# Patient Record
Sex: Female | Born: 1940 | Race: White | Hispanic: No | Marital: Single | Smoking: Never smoker
Health system: Southern US, Community
[De-identification: ages and names within clinical notes are randomized; demographics above are authoritative.]

## PROBLEM LIST (undated history)

## (undated) DIAGNOSIS — F329 Major depressive disorder, single episode, unspecified: Secondary | ICD-10-CM

## (undated) DIAGNOSIS — E785 Hyperlipidemia, unspecified: Secondary | ICD-10-CM

## (undated) DIAGNOSIS — F32A Depression, unspecified: Secondary | ICD-10-CM

## (undated) DIAGNOSIS — I4891 Unspecified atrial fibrillation: Secondary | ICD-10-CM

## (undated) DIAGNOSIS — I10 Essential (primary) hypertension: Secondary | ICD-10-CM

## (undated) HISTORY — PX: APPENDECTOMY: SHX54

## (undated) HISTORY — PX: ABDOMINAL HYSTERECTOMY: SHX81

---

## 2015-11-16 ENCOUNTER — Ambulatory Visit (INDEPENDENT_AMBULATORY_CARE_PROVIDER_SITE_OTHER): Payer: Medicare Other | Admitting: Orthopedic Surgery

## 2015-11-16 ENCOUNTER — Ambulatory Visit (INDEPENDENT_AMBULATORY_CARE_PROVIDER_SITE_OTHER): Payer: Medicare Other

## 2015-11-16 VITALS — BP 137/82 | HR 75 | Ht 66.0 in | Wt 188.0 lb

## 2015-11-16 DIAGNOSIS — M19079 Primary osteoarthritis, unspecified ankle and foot: Secondary | ICD-10-CM

## 2015-11-16 DIAGNOSIS — S92901A Unspecified fracture of right foot, initial encounter for closed fracture: Secondary | ICD-10-CM | POA: Diagnosis not present

## 2015-11-16 DIAGNOSIS — M129 Arthropathy, unspecified: Secondary | ICD-10-CM

## 2015-11-16 MED ORDER — IBUPROFEN 800 MG PO TABS: 800.0000 mg | ORAL_TABLET | Freq: Three times a day (TID) | ORAL | Status: DC

## 2015-11-16 NOTE — Patient Instructions (Signed)
ASPERCREME (OTC) THREE TIMES DAILY

## 2015-11-18 NOTE — Progress Notes (Signed)
Chief Complaint  Patient presents with  . New Patient (Initial Visit)    Right foot pain   HPI 75 year old female dropped a vacuum cleaner handle on her right foot. Had no prior symptoms of pain area but since that time about a month ago she's had pain over the dorsum of the foot is described best as aching dull moderate in severity and constant worse with weightbearing  Review of Systems  Constitutional: Negative for fever and chills.  Neurological: Negative for tingling.    No past medical history on file.  No past surgical history on file. No family history on file. Social History  Substance Use Topics  . Smoking status: Not on file  . Smokeless tobacco: Not on file  . Alcohol Use: Not on file    Current outpatient prescriptions:  .  Cholecalciferol (VITAMIN D PO), Take by mouth., Disp: , Rfl:  .  hydrochlorothiazide (MICROZIDE) 12.5 MG capsule, Take 12.5 mg by mouth daily., Disp: , Rfl:  .  ibuprofen (ADVIL,MOTRIN) 800 MG tablet, Take 1 tablet (800 mg total) by mouth 3 (three) times daily., Disp: 90 tablet, Rfl: 5 .  losartan (COZAAR) 50 MG tablet, Take 50 mg by mouth daily., Disp: , Rfl:  .  montelukast (SINGULAIR) 10 MG tablet, Take 10 mg by mouth at bedtime., Disp: , Rfl:  .  Omega-3 Fatty Acids (FISH OIL PO), Take by mouth., Disp: , Rfl:  .  omeprazole (PRILOSEC OTC) 20 MG tablet, Take 20 mg by mouth daily., Disp: , Rfl:  .  simvastatin (ZOCOR) 10 MG tablet, Take 10 mg by mouth daily., Disp: , Rfl:   BP 137/82 mmHg  Pulse 75  Ht 5\' 6"  (1.676 m)  Wt 188 lb (85.276 kg)  BMI 30.36 kg/m2  Physical Exam  Constitutional: She is oriented to person, place, and time. She appears well-developed and well-nourished. No distress.  Cardiovascular: Normal rate and intact distal pulses.   Neurological: She is alert and oriented to person, place, and time. She has normal reflexes. She exhibits normal muscle tone. Coordination normal.  Skin: Skin is warm and dry. No rash noted. She  is not diaphoretic. No erythema. No pallor.  Psychiatric: She has a normal mood and affect. Her behavior is normal. Judgment and thought content normal.    Ortho Exam Standing posture is normal no flatfoot deformity is noted. She ambulates normally.  She has a dorsal spur on the top of the right foot at the midfoot area which is tender to palpation without swelling there does not appear to be joint effusion. The midfoot is stable. She has no atrophy in the foot. Skin is without rash or laceration. There is no erythema.  Pulses normal temperature normal. Sensation is normal in the foot as well.  Left foot and comparison is nontender and alignment is normal.  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 losartan (COZAAR) 50 MG tablet    Sig: Take 50 mg by mouth daily.  . hydrochlorothiazide (MICROZIDE) 12.5 MG capsule    Sig: Take 12.5 mg by mouth daily.  . simvastatin (ZOCOR) 10 MG tablet    Sig: Take 10 mg by mouth daily.  . montelukast (SINGULAIR) 10 MG tablet  Sig: Take 10 mg by mouth at bedtime.  . Omega-3 Fatty Acids (FISH OIL PO)    Sig: Take by mouth.  . Cholecalciferol (VITAMIN D PO)    Sig: Take by mouth.  Marland Kitchen omeprazole (PRILOSEC OTC) 20 MG tablet    Sig: Take 20 mg by mouth daily.  Marland Kitchen ibuprofen (ADVIL,MOTRIN) 800 MG tablet    Sig: Take 1 tablet (800 mg total) by mouth 3 (three) times daily.    Dispense:  90 tablet    Refill:  5

## 2016-01-14 ENCOUNTER — Emergency Department (HOSPITAL_COMMUNITY)
Admission: EM | Admit: 2016-01-14 | Discharge: 2016-01-14 | Disposition: A | Discharge status: Home / Self Care | Payer: Medicare Other | Attending: Emergency Medicine | Admitting: Emergency Medicine

## 2016-01-14 ENCOUNTER — Emergency Department (HOSPITAL_COMMUNITY): Payer: Medicare Other

## 2016-01-14 ENCOUNTER — Encounter (HOSPITAL_COMMUNITY): Payer: Self-pay | Admitting: Emergency Medicine

## 2016-01-14 DIAGNOSIS — R002 Palpitations: Secondary | ICD-10-CM | POA: Diagnosis present

## 2016-01-14 DIAGNOSIS — Z79899 Other long term (current) drug therapy: Secondary | ICD-10-CM | POA: Insufficient documentation

## 2016-01-14 DIAGNOSIS — Z791 Long term (current) use of non-steroidal anti-inflammatories (NSAID): Secondary | ICD-10-CM | POA: Diagnosis not present

## 2016-01-14 DIAGNOSIS — I10 Essential (primary) hypertension: Secondary | ICD-10-CM | POA: Insufficient documentation

## 2016-01-14 DIAGNOSIS — I480 Paroxysmal atrial fibrillation: Secondary | ICD-10-CM | POA: Diagnosis not present

## 2016-01-14 DIAGNOSIS — I4892 Unspecified atrial flutter: Secondary | ICD-10-CM

## 2016-01-14 HISTORY — DX: Hyperlipidemia, unspecified: E78.5

## 2016-01-14 HISTORY — DX: Major depressive disorder, single episode, unspecified: F32.9

## 2016-01-14 HISTORY — DX: Essential (primary) hypertension: I10

## 2016-01-14 HISTORY — DX: Depression, unspecified: F32.A

## 2016-01-14 LAB — COMPREHENSIVE METABOLIC PANEL
ALK PHOS: 55 U/L (ref 38–126)
ALT: 17 U/L (ref 14–54)
AST: 15 U/L (ref 15–41)
Albumin: 4 g/dL (ref 3.5–5.0)
Anion gap: 6 (ref 5–15)
BUN: 28 mg/dL — ABNORMAL HIGH (ref 6–20)
CALCIUM: 8.8 mg/dL — AB (ref 8.9–10.3)
CO2: 23 mmol/L (ref 22–32)
CREATININE: 1.03 mg/dL — AB (ref 0.44–1.00)
Chloride: 107 mmol/L (ref 101–111)
GFR, EST NON AFRICAN AMERICAN: 52 mL/min — AB (ref >60)
Glucose, Bld: 116 mg/dL — ABNORMAL HIGH (ref 65–99)
Potassium: 3.8 mmol/L (ref 3.5–5.1)
Sodium: 136 mmol/L (ref 135–145)
Total Bilirubin: 0.9 mg/dL (ref 0.3–1.2)
Total Protein: 7 g/dL (ref 6.5–8.1)

## 2016-01-14 LAB — CBC WITH DIFFERENTIAL/PLATELET
BASOS PCT: 0 %
Basophils Absolute: 0 10*3/uL (ref 0.0–0.1)
EOS ABS: 0 10*3/uL (ref 0.0–0.7)
EOS PCT: 1 %
HCT: 38.1 % (ref 36.0–46.0)
HEMOGLOBIN: 12.3 g/dL (ref 12.0–15.0)
Lymphocytes Relative: 41 %
Lymphs Abs: 3.4 10*3/uL (ref 0.7–4.0)
MCH: 25.3 pg — ABNORMAL LOW (ref 26.0–34.0)
MCHC: 32.3 g/dL (ref 30.0–36.0)
MCV: 78.4 fL (ref 78.0–100.0)
MONOS PCT: 9 %
Monocytes Absolute: 0.7 10*3/uL (ref 0.1–1.0)
NEUTROS PCT: 49 %
Neutro Abs: 4.1 10*3/uL (ref 1.7–7.7)
PLATELETS: 267 10*3/uL (ref 150–400)
RBC: 4.86 MIL/uL (ref 3.87–5.11)
RDW: 14.6 % (ref 11.5–15.5)
WBC: 8.3 10*3/uL (ref 4.0–10.5)

## 2016-01-14 LAB — TROPONIN I

## 2016-01-14 LAB — TSH: TSH: 1.536 u[IU]/mL (ref 0.350–4.500)

## 2016-01-14 MED ORDER — DILTIAZEM HCL 25 MG/5ML IV SOLN
20.0000 mg | Freq: Once | INTRAVENOUS | Status: AC
  Administered 2016-01-14: 20 mg via INTRAVENOUS
  Filled 2016-01-14: qty 5

## 2016-01-14 MED ORDER — DILTIAZEM HCL ER COATED BEADS 120 MG PO CP24: 120.0000 mg | ORAL_CAPSULE | Freq: Every day | ORAL | 1 refills | Status: DC

## 2016-01-14 MED ORDER — APIXABAN 5 MG PO TABS
ORAL_TABLET | ORAL | Status: AC
  Filled 2016-01-14: qty 1

## 2016-01-14 MED ORDER — APIXABAN 5 MG PO TABS
5.0000 mg | ORAL_TABLET | Freq: Two times a day (BID) | ORAL | Status: AC
  Administered 2016-01-14: 5 mg via ORAL
  Filled 2016-01-14: qty 1

## 2016-01-14 MED ORDER — LOSARTAN POTASSIUM 50 MG PO TABS: 50.0000 mg | ORAL_TABLET | Freq: Every day | ORAL | 1 refills | Status: DC

## 2016-01-14 MED ORDER — APIXABAN 5 MG PO TABS: 5.0000 mg | ORAL_TABLET | Freq: Two times a day (BID) | ORAL | 1 refills | Status: DC

## 2016-01-14 NOTE — Discharge Instructions (Signed)
Stop taking the medicine called Losartan /HCTZ - this is your current blood pressure medications  Start taking Losartan 50mg  tablet once daily Start taking Diltiazem 120 mg once daily Start taking Eliquis every 12 hours (5mg  tab)  Call the afib clinic on Monday for follow up.  Please obtain all of your results from medical records or have your doctors office obtain the results - share them with your doctor - you should be seen at your doctors office in the next 2 days. Call today to arrange your follow up. Take the medications as prescribed. Please review all of the medicines and only take them if you do not have an allergy to them. Please be aware that if you are taking birth control pills, taking other prescriptions, ESPECIALLY ANTIBIOTICS may make the birth control ineffective - if this is the case, either do not engage in sexual activity or use alternative methods of birth control such as condoms until you have finished the medicine and your family doctor says it is OK to restart them. If you are on a blood thinner such as COUMADIN, be aware that any other medicine that you take may cause the coumadin to either work too much, or not enough - you should have your coumadin level rechecked in next 7 days if this is the case.  ?  It is also a possibility that you have an allergic reaction to any of the medicines that you have been prescribed - Everybody reacts differently to medications and while MOST people have no trouble with most medicines, you may have a reaction such as nausea, vomiting, rash, swelling, shortness of breath. If this is the case, please stop taking the medicine immediately and contact your physician.  ?  You should return to the ER if you develop severe or worsening symptoms.

## 2016-01-14 NOTE — ED Provider Notes (Signed)
[facility]EM[facility]Y DEPT Provider Note   CSN: [id number] Arrival date & time: 01/14/16  1339  First Provider Contact:   First MD Initiated Contact with Patient 01/14/16 1352     By signing my name below, I, Tanda Rockers, attest that this documentation has been prepared under the direction and in the presence of Eber Hong, MD. Electronically Signed: Tanda Rockers, ED Scribe. 01/14/16. 1:59 PM   History   Chief Complaint Chief Complaint  Patient presents with  . Palpitations    HPI Elizabeth Sherman is a 75 y.o. female with PMHx HTN, HLD, who presents to the Emergency Department for evaluation. Pt was seen at PCP's office 5 days ago to get a new prescription. She states that her doctor told her "your heart is going every which way." Pt reports that she came to the ED today for evaluation of her heart. Pt reports that she had had intermittent palpations for some time but is not having it now and feels completely fine. She was started on a new anti-depressant medication 5 days ago but has otherwise not had any changes in medication in the past month. Denies palpitation, shortness of breath, chest pain, fever, cough, leg swelling, unexpected weight loss, unexpected weight gain, or any other associated symptoms.  No hx kidney issues. No hx lung issues. Pt does not drink EtOH, smokes cigarettes, or uses illicit drugs.   The history is provided by the patient. No language interpreter was used.    Past Medical History:  Diagnosis Date  . Depression   . Hyperlipidemia   . Hypertension     There are no active problems to display for this patient.   Past Surgical History:  Procedure Laterality Date  . ABDOMINAL HYSTERECTOMY    . APPENDECTOMY      OB History    Gravida Para Term Preterm AB Living   SAB TAB Ectopic Multiple Live Births                   Home Medications    Prior to Admission medications   Medication Sig Start Date End Date Taking? Authorizing  Provider  Cholecalciferol (VITAMIN D PO) Take by mouth.   Yes Historical Provider, MD  citalopram (CELEXA) 20 MG tablet Take 1 tablet by mouth daily. 01/11/16  Yes Historical Provider, MD  ibuprofen (ADVIL,MOTRIN) 800 MG tablet Take 1 tablet (800 mg total) by mouth 3 (three) times daily. 11/16/15  Yes Vickki Hearing, MD  montelukast (SINGULAIR) 10 MG tablet Take 10 mg by mouth at bedtime.   Yes Historical Provider, MD  Omega-3 Fatty Acids (FISH OIL PO) Take by mouth.   Yes Historical Provider, MD  omeprazole (PRILOSEC OTC) 20 MG tablet Take 20 mg by mouth daily.   Yes Historical Provider, MD  simvastatin (ZOCOR) 10 MG tablet Take 10 mg by mouth daily.   Yes Historical Provider, MD  apixaban (ELIQUIS) 5 MG TABS tablet Take 1 tablet (5 mg total) by mouth 2 (two) times daily. 01/14/16   Eber Hong, MD  diltiazem (CARDIZEM CD) 120 MG 24 hr capsule Take 1 capsule (120 mg total) by mouth daily. 01/14/16   Eber Hong, MD  losartan (COZAAR) 50 MG tablet Take 1 tablet (50 mg total) by mouth daily. 01/14/16   Eber Hong, MD    Family History Family History  Problem Relation Age of Onset  . Heart failure Mother     Social History Social  History  Substance Use Topics  . Smoking status: Never Smoker  . Smokeless tobacco: Never Used  . Alcohol use No     Allergies   Review of patient's allergies indicates no known allergies.   Review of Systems Review of Systems  Constitutional: Negative for fever and unexpected weight change.  Respiratory: Negative for shortness of breath.   Cardiovascular: Negative for chest pain, palpitations and leg swelling.  All other systems reviewed and are negative.    Physical Exam Updated Vital Signs BP 110/73   Pulse 76   Resp 15   SpO2 97%   Physical Exam  Constitutional: She appears well-developed and well-nourished. No distress.  HENT:  Head: Normocephalic and atraumatic.  Mouth/Throat: Oropharynx is clear and moist. No oropharyngeal exudate.   Eyes: Conjunctivae and EOM are normal. Pupils are equal, round, and reactive to light. Right eye exhibits no discharge. Left eye exhibits no discharge. No scleral icterus.  Neck: Normal range of motion. Neck supple. No JVD present. No thyromegaly present.  Cardiovascular: Normal heart sounds and intact distal pulses.  An irregularly irregular rhythm present. Tachycardia present.  Exam reveals no gallop and no friction rub.   No murmur heard. Pulses:      Radial pulses are 2+ on the right side, and 2+ on the left side.  Pulmonary/Chest: Effort normal and breath sounds normal. No respiratory distress. She has no wheezes. She has no rales.  Abdominal: Soft. Bowel sounds are normal. She exhibits no distension and no mass. There is no tenderness.  Musculoskeletal: Normal range of motion. She exhibits no edema or tenderness.  Lymphadenopathy:    She has no cervical adenopathy.  Neurological: She is alert. Coordination normal.  Skin: Skin is warm and dry. No rash noted. No erythema.  Psychiatric: She has a normal mood and affect. Her behavior is normal.  Nursing note and vitals reviewed.    ED Treatments / Results   DIAGNOSTIC STUDIES: Oxygen Saturation is 100% on RA, normal by my interpretation.    COORDINATION OF CARE: 1:57 PM-Discussed treatment plan with pt at bedside and pt agreed to plan.   Labs (all labs ordered are listed, but only abnormal results are displayed) Labs Reviewed  COMPREHENSIVE METABOLIC PANEL - Abnormal; Notable for the following:       Result Value   Glucose, Bld 116 (*)    BUN 28 (*)    Creatinine, Ser 1.03 (*)    Calcium 8.8 (*)    GFR calc non Af Amer 52 (*)    All other components within normal limits  CBC WITH DIFFERENTIAL/PLATELET - Abnormal; Notable for the following:    MCH 25.3 (*)    All other components within normal limits  TROPONIN I  TSH    EKG  EKG Interpretation  Date/Time:  Saturday January 14 2016 13:48:16 EDT Ventricular Rate:   122 PR Interval:    QRS Duration: 58 QT Interval:  383 QTC Calculation: 546 R Axis:   -37 Text Interpretation:  Atrial flutter with variable A-V block Nonspecific T wave abnormality No old tracing to compare Abnormal ekg Confirmed by Hyacinth Meeker  MD, Harris Kistler ([id number]) on 01/14/2016 2:09:47 PM       Radiology Dg Chest 2 View  Result Date: 01/14/2016 CLINICAL DATA:  Cardiac palpitations EXAM: CHEST  2 VIEW COMPARISON:  None. FINDINGS: Cardiac shadow is enlarged. A large hiatal hernia is noted. The lungs are well aerated bilaterally. No focal infiltrate or sizable effusion is seen. No acute bony abnormality  is noted. IMPRESSION: Hiatal hernia. No other focal abnormality is noted. Electronically Signed   By: Alcide Clever M.D.   On: 01/14/2016 14:41   Procedures Procedures (including critical care time)  Medications Ordered in ED Medications  apixaban (ELIQUIS) tablet 5 mg (not administered)  diltiazem (CARDIZEM) injection 20 mg (20 mg Intravenous Given 01/14/16 1405)     Initial Impression / Assessment and Plan / ED Course  I have reviewed the triage vital signs and the nursing notes.  Pertinent labs & imaging results that were available during my care of the patient were reviewed by me and considered in my medical decision making (see chart for details).  Clinical Course  Comment By Time  The patient has normal creatinine, BUN is slightly elevated, electrolytes otherwise normal. Troponin normal, blood counts unremarkable, thyroid panel can be followed up by the family doctor. The heart rate has improved to between 60 and 70, the patient remains with an abnormal rhythm which is likely atrial flutter. She will be transitioned on oral medications and encouraged to follow-up closely with cardiology. Eber Hong, MD 07/29 1447  Care was discussed with the cardiologist, Dr. Duke Salvia at 3:00 PM, she recommends starting Eliquis twice a day, adding long-acting Cardizem once a day in place of her  current blood pressure medication is a combination of losartan and hydrochlorothiazide. The patient has been informed that she will be given a new medication eliminating hydrochlorothiazide from her medication regimen. I have discussed with the patient at length the indications for blood thinner as well as the risks of being on an anticoagulant. She is accepting these risks including life-threatening bleeding. She will be given a dose of Eliquis and sent home with prescriptions for the same. She is currently rate controlled very well with no symptoms, she will follow up in [facility] clinic. Eber Hong, MD 07/29 1503       I personally performed the services described in this documentation, which was scribed in my presence. The recorded information has been reviewed and is accurate.      Final Clinical Impressions(s) / ED Diagnoses   Final diagnoses:  Atrial flutter with rapid ventricular response (HCC)    New Prescriptions New Prescriptions   APIXABAN (ELIQUIS) 5 MG TABS TABLET    Take 1 tablet (5 mg total) by mouth 2 (two) times daily.   DILTIAZEM (CARDIZEM CD) 120 MG 24 HR CAPSULE    Take 1 capsule (120 mg total) by mouth daily.   LOSARTAN (COZAAR) 50 MG TABLET    Take 1 tablet (50 mg total) by mouth daily.     Eber Hong, MD 01/14/16 [phone]

## 2016-01-14 NOTE — ED Notes (Signed)
Pt denies SOB, CP, palpitations, or diaphoresis. When asked why she came to ED today she reports "to get checked out", denies any cardiac hx. Pt is A&O X4.

## 2016-01-14 NOTE — ED Notes (Signed)
AC to bring Eliquis.  

## 2016-01-14 NOTE — ED Triage Notes (Signed)
Patient reports intermittent feelings of sweating with palpitations since Thursday. Denies any chest pain or shortness of breath. Per patient was seen by PCP on Tuesday and states "He told me my heart was going every which a way." Denies any cardiac hx.

## 2016-01-14 NOTE — ED Notes (Signed)
Pt returned from xray at this time.

## 2016-01-20 ENCOUNTER — Encounter (HOSPITAL_COMMUNITY): Payer: Self-pay | Admitting: Nurse Practitioner

## 2016-01-20 ENCOUNTER — Ambulatory Visit (HOSPITAL_COMMUNITY)
Admission: RE | Admit: 2016-01-20 | Discharge: 2016-01-20 | Disposition: A | Discharge status: Home / Self Care | Payer: Medicare Other | Source: Ambulatory Visit | Attending: Nurse Practitioner | Admitting: Nurse Practitioner

## 2016-01-20 VITALS — BP 148/80 | HR 103 | Ht 66.0 in | Wt 191.2 lb

## 2016-01-20 DIAGNOSIS — I481 Persistent atrial fibrillation: Secondary | ICD-10-CM | POA: Diagnosis not present

## 2016-01-20 DIAGNOSIS — Z7982 Long term (current) use of aspirin: Secondary | ICD-10-CM | POA: Insufficient documentation

## 2016-01-20 DIAGNOSIS — Z90710 Acquired absence of both cervix and uterus: Secondary | ICD-10-CM | POA: Insufficient documentation

## 2016-01-20 DIAGNOSIS — Z791 Long term (current) use of non-steroidal anti-inflammatories (NSAID): Secondary | ICD-10-CM | POA: Diagnosis not present

## 2016-01-20 DIAGNOSIS — F329 Major depressive disorder, single episode, unspecified: Secondary | ICD-10-CM | POA: Insufficient documentation

## 2016-01-20 DIAGNOSIS — Z8249 Family history of ischemic heart disease and other diseases of the circulatory system: Secondary | ICD-10-CM | POA: Diagnosis not present

## 2016-01-20 DIAGNOSIS — I10 Essential (primary) hypertension: Secondary | ICD-10-CM | POA: Insufficient documentation

## 2016-01-20 DIAGNOSIS — Z7901 Long term (current) use of anticoagulants: Secondary | ICD-10-CM | POA: Diagnosis not present

## 2016-01-20 DIAGNOSIS — Z9889 Other specified postprocedural states: Secondary | ICD-10-CM | POA: Diagnosis not present

## 2016-01-20 DIAGNOSIS — I4891 Unspecified atrial fibrillation: Secondary | ICD-10-CM | POA: Insufficient documentation

## 2016-01-20 DIAGNOSIS — I4819 Other persistent atrial fibrillation: Secondary | ICD-10-CM

## 2016-01-20 DIAGNOSIS — Z79899 Other long term (current) drug therapy: Secondary | ICD-10-CM | POA: Insufficient documentation

## 2016-01-20 DIAGNOSIS — E785 Hyperlipidemia, unspecified: Secondary | ICD-10-CM | POA: Insufficient documentation

## 2016-01-20 MED ORDER — APIXABAN 5 MG PO TABS: 5.0000 mg | ORAL_TABLET | Freq: Two times a day (BID) | ORAL | 1 refills | Status: DC

## 2016-01-20 NOTE — Progress Notes (Signed)
Patient ID: [name], female   DOB: 27-Jul-1940, 75 y.o.   MRN: [id number]      Primary Care Physician: Maximiano Coss, MD Referring Physician: [facility] f/u   Elizabeth Sherman is a 75 y.o. female with a h/o new onset afib. This was dx at her PCP recently, picked up at the exam, pt was unaware. She then decided she would go to the ER for her heart to be evaluated. She was asymptomatic. She was given one eliquis for that evening, rx for cardizem, and was referred here.  She is now on cardizem and is in afib at 53 bpm, again states that she feels well and denies any shortness of breath, or fatigue. 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 She does not thinks she snores, no smoking, no alcohol, no large amounts of caffeine.Chadsvasc score of at least 4.  Today, she denies symptoms of palpitations, chest pain, shortness of breath, orthopnea, PND, lower extremity edema, dizziness, presyncope, syncope, or neurologic sequela. The patient is tolerating medications without difficulties and is otherwise without complaint today.   Past Medical History:  Diagnosis Date  . Depression   . Hyperlipidemia   . Hypertension    Past Surgical History:  Procedure Laterality Date  . ABDOMINAL HYSTERECTOMY    . APPENDECTOMY      Current Outpatient Prescriptions  Medication Sig Dispense Refill  . aspirin 325 MG tablet Take 325 mg by mouth daily.    . Cholecalciferol (VITAMIN D PO) Take by mouth.    . citalopram (CELEXA) 20 MG tablet Take 1 tablet by mouth daily.  5  . diltiazem (CARDIZEM CD) 120 MG 24 hr capsule Take 1 capsule (120 mg total) by mouth daily. 30 capsule 1  . ibuprofen (ADVIL,MOTRIN) 800 MG tablet Take 1 tablet (800 mg total) by mouth 3 (three) times daily. 90 tablet 5  . losartan (COZAAR) 50 MG tablet Take 1 tablet (50 mg total) by mouth daily. 30 tablet 1  . montelukast (SINGULAIR) 10 MG tablet Take 10 mg by mouth at bedtime.    . Omega-3 Fatty  Acids (FISH OIL PO) Take by mouth.    Marland Kitchen omeprazole (PRILOSEC OTC) 20 MG tablet Take 20 mg by mouth daily.    . simvastatin (ZOCOR) 10 MG tablet Take 10 mg by mouth daily.    Marland Kitchen apixaban (ELIQUIS) 5 MG TABS tablet Take 1 tablet (5 mg total) by mouth 2 (two) times daily. 60 tablet 1   No current facility-administered medications for this encounter.     No Known Allergies  Social History   Social History  . Marital status: Single    Spouse name: N/A  . Number of children: N/A  . Years of education: N/A   Occupational History  . Not on file.   Social History Main Topics  . Smoking status: Never Smoker  . Smokeless tobacco: Never Used  . Alcohol use No  . Drug use: No  . Sexual activity: Not on file   Other Topics Concern  . Not on file   Social History Narrative  . No narrative on file    Family History  Problem Relation Age of Onset  . Heart failure Mother     ROS- All systems are reviewed and negative except as per the HPI above  Physical Exam: Vitals:   01/20/16 0844  BP: (!) 148/80  Pulse: (!) 103  Weight: 191 lb 3.2 oz (86.7 kg)  Height:  5\' 6"  (1.676 m)    GEN- The patient is well appearing, alert and oriented x 3 today.   Head- normocephalic, atraumatic Eyes-  Sclera clear, conjunctiva pink Ears- hearing intact Oropharynx- clear Neck- supple, no JVP Lymph- no cervical lymphadenopathy Lungs- Clear to ausculation bilaterally, normal work of breathing Heart- irregular rate and rhythm, no murmurs, rubs or gallops, PMI not laterally displaced GI- soft, NT, ND, + BS Extremities- no clubbing, cyanosis, or edema MS- no significant deformity or atrophy Skin- no rash or lesion Psych- euthymic mood, full affect Neuro- strength and sensation are intact  EKG-afib at 99 bpm Epic records reviewed Creatinine 1.03, BUN 28   Assessment and Plan: 1. New onset afib, asymptomatic Continue cardizem at 180 mg qd, for now, may need to increase rate control on f/u  visits Stop asa Rx for free 30 days eliquis 5 mg bid but pt does not have medicare D and price of drug will be $500, she will call insurance and see if she can get signed up for medicare D so she can afford drug, may have to use coumadin in the interium.  Will consider cardioversion when she has been on anticoagulant x 3 weeks. Echo Educated re afib and management F/u in 2-3 weeks  Elvina Sidle. [facility] [address] [place], [place] [address] [phone]

## 2016-01-20 NOTE — Patient Instructions (Signed)
Your physician has recommended you make the following change in your medication:  1)Stop aspirin 2)Start Eliquis 5mg twice a day  

## 2016-01-23 ENCOUNTER — Encounter (HOSPITAL_COMMUNITY): Payer: Self-pay | Admitting: Emergency Medicine

## 2016-01-23 ENCOUNTER — Telehealth (HOSPITAL_COMMUNITY): Payer: Self-pay | Admitting: Nurse Practitioner

## 2016-01-23 ENCOUNTER — Emergency Department (HOSPITAL_COMMUNITY)
Admission: EM | Admit: 2016-01-23 | Discharge: 2016-01-23 | Disposition: A | Discharge status: Home / Self Care | Payer: Medicare Other | Attending: Emergency Medicine | Admitting: Emergency Medicine

## 2016-01-23 ENCOUNTER — Emergency Department (HOSPITAL_COMMUNITY): Payer: Medicare Other

## 2016-01-23 DIAGNOSIS — I10 Essential (primary) hypertension: Secondary | ICD-10-CM | POA: Diagnosis not present

## 2016-01-23 DIAGNOSIS — I4891 Unspecified atrial fibrillation: Secondary | ICD-10-CM | POA: Insufficient documentation

## 2016-01-23 DIAGNOSIS — Z79899 Other long term (current) drug therapy: Secondary | ICD-10-CM | POA: Diagnosis not present

## 2016-01-23 DIAGNOSIS — R0789 Other chest pain: Secondary | ICD-10-CM

## 2016-01-23 LAB — I-STAT TROPONIN, ED: Troponin i, poc: 0 ng/mL (ref 0.00–0.08)

## 2016-01-23 LAB — CBC
HEMATOCRIT: 38 % (ref 36.0–46.0)
HEMOGLOBIN: 12.3 g/dL (ref 12.0–15.0)
MCH: 25.8 pg — ABNORMAL LOW (ref 26.0–34.0)
MCHC: 32.4 g/dL (ref 30.0–36.0)
MCV: 79.7 fL (ref 78.0–100.0)
Platelets: 265 10*3/uL (ref 150–400)
RBC: 4.77 MIL/uL (ref 3.87–5.11)
RDW: 15.1 % (ref 11.5–15.5)
WBC: 8.7 10*3/uL (ref 4.0–10.5)

## 2016-01-23 LAB — BASIC METABOLIC PANEL
ANION GAP: 6 (ref 5–15)
BUN: 20 mg/dL (ref 6–20)
CHLORIDE: 108 mmol/L (ref 101–111)
CO2: 24 mmol/L (ref 22–32)
Calcium: 8.1 mg/dL — ABNORMAL LOW (ref 8.9–10.3)
Creatinine, Ser: 0.94 mg/dL (ref 0.44–1.00)
GFR calc Af Amer: >60 mL/min (ref >60)
GFR, EST NON AFRICAN AMERICAN: 58 mL/min — AB (ref >60)
GLUCOSE: 115 mg/dL — AB (ref 65–99)
POTASSIUM: 4 mmol/L (ref 3.5–5.1)
SODIUM: 138 mmol/L (ref 135–145)

## 2016-01-23 LAB — TROPONIN I: Troponin I: <0.03 ng/mL (ref <0.03)

## 2016-01-23 NOTE — ED Notes (Signed)
Pt back from xray, family at the bedside   

## 2016-01-23 NOTE — Telephone Encounter (Signed)
Pt called 8:35 this am stating that she had pain in the center of her chest without radiation,no diaphoresis, no nausea, dyspnea.. Does not feel like indigestion and movement of chest does not  worsen. BP 140/80 and HR is 80.I advised for pt to go to ER at [facility], since this is closest  hospital to her. I don't feel that chest pain is related to afib since HR is low. She indicated that this is what she would do.

## 2016-01-23 NOTE — ED Triage Notes (Signed)
Pt reports onset of centralized chest pain this am. Pt was sitting when it started. Denies other symptoms.

## 2016-01-23 NOTE — ED Provider Notes (Signed)
[facility]-EM[facility]Y DEPT Provider Note   CSN: [id number] Arrival date & time: 01/23/16  1052  First Provider Contact:   None    By signing my name below, I, Placido Sou, attest that this documentation has been prepared under the direction and in the presence of Jacalyn Lefevre, MD. Electronically Signed: Placido Sou, ED Scribe. 01/23/16. 12:27 PM.    History   Chief Complaint Chief Complaint  Patient presents with  . Chest Pain    HPI HPI Comments: Elizabeth Sherman is a 75 y.o. female who presents to the Emergency Department complaining of moderate centralized CP which occurred this morning. Pt states her CP began white sitting and watching television and quickly alleviated. She denies any other regions of pain and reports a PMHx of atrial fibrillation. She has an echocardiogram scheduled in 1 week. She is currently on anticoagulants for her a-fib. She denies any other associated symptoms at this time. Pt said that she has been nervous about her recent diagnosis of her a.fib.  She did call her cardiologist prior to coming in and they told her to come to the ED.  Pt said that she does not think it is her heart, but wanted to make sure.  The history is provided by the patient. No language interpreter was used.    Past Medical History:  Diagnosis Date  . Depression   . Hyperlipidemia   . Hypertension     There are no active problems to display for this patient.   Past Surgical History:  Procedure Laterality Date  . ABDOMINAL HYSTERECTOMY    . APPENDECTOMY      OB History    Gravida Para Term Preterm AB Living   2 2 2     2    SAB TAB Ectopic Multiple Live Births                   Home Medications    Prior to Admission medications   Medication Sig Start Date End Date Taking? Authorizing Provider  apixaban (ELIQUIS) 5 MG TABS tablet Take 1 tablet (5 mg total) by mouth 2 (two) times daily. 01/20/16  Yes Newman Nip, NP  Cholecalciferol (VITAMIN D PO) Take by mouth.    Yes Historical Provider, MD  citalopram (CELEXA) 20 MG tablet Take 1 tablet by mouth every evening.  01/11/16  Yes Historical Provider, MD  diltiazem (CARDIZEM CD) 120 MG 24 hr capsule Take 1 capsule (120 mg total) by mouth daily. 01/14/16  Yes Eber Hong, MD  losartan (COZAAR) 50 MG tablet Take 1 tablet (50 mg total) by mouth daily. 01/14/16  Yes Eber Hong, MD  Omega-3 Fatty Acids (FISH OIL PO) Take 1 capsule by mouth daily.    Yes Historical Provider, MD  omeprazole (PRILOSEC OTC) 20 MG tablet Take 20 mg by mouth daily.   Yes Historical Provider, MD  simvastatin (ZOCOR) 10 MG tablet Take 10 mg by mouth daily at 6 PM.    Yes Historical Provider, MD    Family History Family History  Problem Relation Age of Onset  . Heart failure Mother     Social History Social History  Substance Use Topics  . Smoking status: Never Smoker  . Smokeless tobacco: Never Used  . Alcohol use No     Allergies   Review of patient's allergies indicates no known allergies.   Review of Systems Review of Systems  Cardiovascular: Positive for chest pain.  All other systems reviewed and are negative.  Physical Exam  Updated Vital Signs BP 120/67   Pulse 70   Temp 97.6 F (36.4 C) (Oral)   Resp 15   Ht  (1.676 m)   Wt 190 lb (86.2 kg)   SpO2 98%   BMI 30.67 kg/m   Physical Exam  Constitutional: She is oriented to person, place, and time. She appears well-developed and well-nourished.  HENT:  Head: Normocephalic and atraumatic.  Eyes: EOM are normal. Pupils are equal, round, and reactive to light.  Neck: Normal range of motion.  Cardiovascular: Normal rate.   Atrial fibrillation   Pulmonary/Chest: Effort normal and breath sounds normal. No respiratory distress. She has no wheezes. She has no rales.  Abdominal: Soft. There is no tenderness.  Musculoskeletal: Normal range of motion.  Neurological: She is alert and oriented to person, place, and time.  Skin: Skin is warm and dry.    Psychiatric: She has a normal mood and affect. Her behavior is normal.  Nursing note and vitals reviewed.  ED Treatments / Results  Labs (all labs ordered are listed, but only abnormal results are displayed) Labs Reviewed  BASIC METABOLIC PANEL - Abnormal; Notable for the following:       Result Value   Glucose, Bld 115 (*)    Calcium 8.1 (*)    GFR calc non Af Amer 58 (*)    All other components within normal limits  CBC - Abnormal; Notable for the following:    MCH 25.8 (*)    All other components within normal limits  TROPONIN I  I-STAT TROPOININ, ED    EKG  EKG Interpretation  Date/Time:  Monday January 23 2016 10:59:42 EDT Ventricular Rate:  99 PR Interval:    QRS Duration: 68 QT Interval:  401 QTC Calculation: 460 R Axis:   6 Text Interpretation:  Atrial fibrillation Low voltage, precordial leads Borderline repolarization abnormality inferior wall t wave changes appear new Confirmed by Particia Nearing MD, Mazen Marcin ([id number]) on 01/23/2016 12:23:40 PM       Radiology Dg Chest 2 View  Result Date: 01/23/2016 CLINICAL DATA:  Chest pain EXAM: CHEST  2 VIEW COMPARISON:  01/14/2016 FINDINGS: Low volume film. The cardio pericardial silhouette is enlarged. The lungs are clear wiithout focal pneumonia, edema, pneumothorax or pleural effusion. Interstitial markings are diffusely coarsened with chronic features. The visualized bony structures of the thorax are intact. Telemetry leads overlie the chest. Hiatal hernia again noted. IMPRESSION: Cardiomegaly without acute cardiopulmonary findings. Hiatal hernia. Electronically Signed   By: Kennith Center M.D.   On: 01/23/2016 12:03    Procedures Procedures  DIAGNOSTIC STUDIES: Oxygen Saturation is 100% on RA, normal by my interpretation.    COORDINATION OF CARE: 12:25 PM Discussed next steps with pt. Pt verbalized understanding and is agreeable with the plan.    Medications Ordered in ED Medications - No data to display   Initial  Impression / Assessment and Plan / ED Course  I have reviewed the triage vital signs and the nursing notes.  Pertinent labs & imaging results that were available during my care of the patient were reviewed by me and considered in my medical decision making (see chart for details).  Clinical Course   No cp since before 0900.  The pain is atypical in presentation and pt has 2 negative sets of cardiac enzymes.  She has an appt with cardiology in the next few days for an ECHO.  Pt is on Eliquis and she has no sob or continued cp, so I think  the likelihood of a pe is low.  I think pt is ok for d/c.  She knows to return if worse.  Final Clinical Impressions(s) / ED Diagnoses   Final diagnoses:  Atypical chest pain    New Prescriptions New Prescriptions   No medications on file     Jacalyn LefevreJulie Isa Hitz, MD 01/23/16 1510

## 2016-01-30 ENCOUNTER — Other Ambulatory Visit: Payer: Self-pay

## 2016-01-30 ENCOUNTER — Ambulatory Visit (HOSPITAL_COMMUNITY)
Admission: RE | Admit: 2016-01-30 | Discharge: 2016-01-30 | Disposition: A | Discharge status: Home / Self Care | Payer: Medicare Other | Source: Ambulatory Visit | Attending: Nurse Practitioner | Admitting: Nurse Practitioner

## 2016-01-30 ENCOUNTER — Encounter (HOSPITAL_COMMUNITY): Payer: Self-pay | Admitting: Nurse Practitioner

## 2016-01-30 ENCOUNTER — Ambulatory Visit (HOSPITAL_BASED_OUTPATIENT_CLINIC_OR_DEPARTMENT_OTHER)
Admission: RE | Admit: 2016-01-30 | Discharge: 2016-01-30 | Disposition: A | Discharge status: Home / Self Care | Payer: Medicare Other | Source: Ambulatory Visit | Attending: Nurse Practitioner | Admitting: Nurse Practitioner

## 2016-01-30 VITALS — BP 160/84 | Ht 66.0 in | Wt 192.4 lb

## 2016-01-30 DIAGNOSIS — I4819 Other persistent atrial fibrillation: Secondary | ICD-10-CM

## 2016-01-30 DIAGNOSIS — E785 Hyperlipidemia, unspecified: Secondary | ICD-10-CM | POA: Diagnosis not present

## 2016-01-30 DIAGNOSIS — I481 Persistent atrial fibrillation: Secondary | ICD-10-CM | POA: Diagnosis not present

## 2016-01-30 DIAGNOSIS — F329 Major depressive disorder, single episode, unspecified: Secondary | ICD-10-CM | POA: Insufficient documentation

## 2016-01-30 DIAGNOSIS — R9431 Abnormal electrocardiogram [ECG] [EKG]: Secondary | ICD-10-CM | POA: Diagnosis not present

## 2016-01-30 DIAGNOSIS — I119 Hypertensive heart disease without heart failure: Secondary | ICD-10-CM | POA: Insufficient documentation

## 2016-01-30 DIAGNOSIS — I340 Nonrheumatic mitral (valve) insufficiency: Secondary | ICD-10-CM | POA: Insufficient documentation

## 2016-01-30 DIAGNOSIS — I4891 Unspecified atrial fibrillation: Secondary | ICD-10-CM | POA: Diagnosis present

## 2016-01-30 NOTE — Progress Notes (Signed)
  Echocardiogram 2D Echocardiogram has been performed.  Arvil ChacoFoster, Elvan Ebron 01/30/2016, 3:07 PM

## 2016-01-30 NOTE — Patient Instructions (Signed)
Cardioversion scheduled for Friday, September 1st  - Ar[facility] at [facility] and go to admitting at [facility]10[facility]  -Do not eat or drink anything after midnight the night prior to your procedure.  - Take all your medication with a sip of water prior to arrival.  - You will not be able to drive home after your procedure.

## 2016-01-31 NOTE — Progress Notes (Signed)
Patient ID: Elizabeth Sherman, female   DOB: 1941/04/01, 75 y.o.   MRN: [id number]      Primary Care Physician: Maximiano CossHUNGARLAND,JOHN DAVID, MD Referring Physician[facility]   Elizabeth Sherman is a 75 y.o. female with a h/o new onset afib. This was dx at her PCP recently, picked up at the exam, pt was unaware. She then decided she would go to the ER for her heart to be evaluated. She was asymptomatic. She was given one eliquis for that evening, rx for cardizem, and was referred here.  She is now on cardizem and is in afib at 53 bpm, again states that she feels well and denies any shortness of breath, or fatigue. 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 She does not thinks she snores, no smoking, no alcohol, no large amounts of caffeine.Chadsvasc score of at least 4.  Pt here for f/u and is feeling well. She had one episode of chest pain [facility] which [facility] and it had resolved by the time she reached the ER and has not returned. She is still unaware of afib but would like to attempt cardioversion when she has bee on blood thinner x 3 weeks. No issues with the anticoagulant and is taking regularly.  Today, she denies symptoms of palpitations, chest pain, shortness of breath, orthopnea, PND, lower extremity edema, dizziness, presyncope, syncope, or neurologic sequela. The patient is tolerating medications without difficulties and is otherwise without complaint today.   Past Medical History:  Diagnosis Date  . Depression   . Hyperlipidemia   . Hypertension    Past Surgical History:  Procedure Laterality Date  . ABDOMINAL HYSTERECTOMY    . APPENDECTOMY      Current Outpatient Prescriptions  Medication Sig Dispense Refill  . apixaban (ELIQUIS) 5 MG TABS tablet Take 1 tablet (5 mg total) by mouth 2 (two) times daily. 60 tablet 1  . Cholecalciferol (VITAMIN D PO) Take by mouth.    . citalopram (CELEXA) 20 MG tablet Take 1 tablet by  mouth every evening.   5  . diltiazem (CARDIZEM CD) 120 MG 24 hr capsule Take 1 capsule (120 mg total) by mouth daily. 30 capsule 1  . losartan (COZAAR) 50 MG tablet Take 1 tablet (50 mg total) by mouth daily. 30 tablet 1  . Omega-3 Fatty Acids (FISH OIL PO) Take 1 capsule by mouth daily.     Marland Kitchen. omeprazole (PRILOSEC OTC) 20 MG tablet Take 20 mg by mouth daily.    . simvastatin (ZOCOR) 10 MG tablet Take 10 mg by mouth daily at 6 PM.      No current facility-administered medications for this encounter.     No Known Allergies  Social History   Social History  . Marital status: Single    Spouse name: N/A  . Number of children: N/A  . Years of education: N/A   Occupational History  . Not on file.   Social History Main Topics  . Smoking status: Never Smoker  . Smokeless tobacco: Never Used  . Alcohol use No  . Drug use: No  . Sexual activity: Not on file   Other Topics Concern  . Not on file   Social History Narrative  . No narrative on file    Family History  Problem Relation Age of Onset  . Heart failure Mother     ROS- All systems are reviewed and negative  except as per the HPI above  Physical Exam: Vitals:   01/30/16 1505  BP: (!) 160/84  Weight: 192 lb 6.4 oz (87.3 kg)  Height: 5\' 6"  (1.676 m)    GEN- The patient is well appearing, alert and oriented x 3 today.   Head- normocephalic, atraumatic Eyes-  Sclera clear, conjunctiva pink Ears- hearing intact Oropharynx- clear Neck- supple, no JVP Lymph- no cervical lymphadenopathy Lungs- Clear to ausculation bilaterally, normal work of breathing Heart- irregular rate and rhythm, no murmurs, rubs or gallops, PMI not laterally displaced GI- soft, NT, ND, + BS Extremities- no clubbing, cyanosis, or edema MS- no significant deformity or atrophy Skin- no rash or lesion Psych- euthymic mood, full affect Neuro- strength and sensation are intact  EKG-afib at 96 bpm Epic records reviewed Creatinine 1.03, BUN  28 Echo-- Left ventricle: The cavity size was normal. Wall thickness was   increased in a pattern of mild LVH. Systolic function was normal.   The estimated ejection fraction was in the range of 60% to 65%.   Indeterminant diastolic function (atrial fibrillation). Wall   motion was normal; there were no regional wall motion   abnormalities. - Aortic valve: There was no stenosis. - Mitral valve: There was trivial regurgitation. - Left atrium: The atrium was severely dilated, 41 mm. - Right ventricle: The cavity size was normal. Systolic function   was normal. - Right atrium: The atrium was severely dilated. - Pulmonary arteries: No complete TR doppler jet so unable to   estimate PA systolic pressure. - Inferior vena cava: The vessel was normal in size. The   respirophasic diameter changes were in the normal range (>= 50%),   consistent with normal central venous pressure. - Pericardium, extracardiac: Prominent epicardial adipose tissue.  Impressions:  - The patient was in atrial fibrillation. Normal LV size with mild   LV hypertrophy. EF 60-65%. Normal RV size and systolic function.   No significant valvular abnormalities. Severe biatrial   enlargement.  Assessment and Plan: 1. New onset afib, asymptomatic Continue cardizem at 180 mg qd  Off asa Continue eliquis 5 mg bid, pt does not have medicare D, but has signed up for it, will go into effect 06/2016 Scheduled for cardioversion 9/1, again reminded not to skip any doses of anticoagulant Will return for CBC, BMET closer to procedure   F/u inafib clinic one week after cardioversion  Lupita LeashDonna C. Matthew Folksarroll, ANP-C [facility] [address] [place], [place] [address] [phone]

## 2016-02-13 ENCOUNTER — Ambulatory Visit (HOSPITAL_COMMUNITY)
Admission: RE | Admit: 2016-02-13 | Discharge: 2016-02-13 | Disposition: A | Discharge status: Home / Self Care | Payer: Medicare Other | Source: Ambulatory Visit | Attending: Nurse Practitioner | Admitting: Nurse Practitioner

## 2016-02-13 DIAGNOSIS — I4891 Unspecified atrial fibrillation: Secondary | ICD-10-CM

## 2016-02-13 LAB — CBC
HCT: 37.9 % (ref 36.0–46.0)
Hemoglobin: 11.6 g/dL — ABNORMAL LOW (ref 12.0–15.0)
MCH: 24.7 pg — ABNORMAL LOW (ref 26.0–34.0)
MCHC: 30.6 g/dL (ref 30.0–36.0)
MCV: 80.6 fL (ref 78.0–100.0)
PLATELETS: 276 10*3/uL (ref 150–400)
RBC: 4.7 MIL/uL (ref 3.87–5.11)
RDW: 14.3 % (ref 11.5–15.5)
WBC: 8.2 10*3/uL (ref 4.0–10.5)

## 2016-02-13 LAB — BASIC METABOLIC PANEL
ANION GAP: 4 — AB (ref 5–15)
BUN: 20 mg/dL (ref 6–20)
CALCIUM: 8.6 mg/dL — AB (ref 8.9–10.3)
CO2: 26 mmol/L (ref 22–32)
CREATININE: 1.01 mg/dL — AB (ref 0.44–1.00)
Chloride: 109 mmol/L (ref 101–111)
GFR calc non Af Amer: 53 mL/min — ABNORMAL LOW (ref >60)
GLUCOSE: 110 mg/dL — AB (ref 65–99)
POTASSIUM: 4.3 mmol/L (ref 3.5–5.1)
SODIUM: 139 mmol/L (ref 135–145)

## 2016-02-17 ENCOUNTER — Ambulatory Visit (HOSPITAL_COMMUNITY): Payer: Medicare Other | Admitting: Certified Registered Nurse Anesthetist

## 2016-02-17 ENCOUNTER — Encounter (HOSPITAL_COMMUNITY): Payer: Self-pay | Admitting: *Deleted

## 2016-02-17 ENCOUNTER — Ambulatory Visit (HOSPITAL_COMMUNITY)
Admission: RE | Admit: 2016-02-17 | Discharge: 2016-02-17 | Disposition: A | Discharge status: Home / Self Care | Payer: Medicare Other | Source: Ambulatory Visit | Attending: Internal Medicine | Admitting: Internal Medicine

## 2016-02-17 ENCOUNTER — Encounter (HOSPITAL_COMMUNITY)
Admission: RE | Disposition: A | Discharge status: Home / Self Care | Payer: Self-pay | Source: Ambulatory Visit | Attending: Internal Medicine

## 2016-02-17 DIAGNOSIS — I4819 Other persistent atrial fibrillation: Secondary | ICD-10-CM

## 2016-02-17 DIAGNOSIS — I4891 Unspecified atrial fibrillation: Secondary | ICD-10-CM | POA: Insufficient documentation

## 2016-02-17 DIAGNOSIS — I10 Essential (primary) hypertension: Secondary | ICD-10-CM | POA: Insufficient documentation

## 2016-02-17 DIAGNOSIS — Z79899 Other long term (current) drug therapy: Secondary | ICD-10-CM | POA: Insufficient documentation

## 2016-02-17 DIAGNOSIS — F329 Major depressive disorder, single episode, unspecified: Secondary | ICD-10-CM | POA: Diagnosis not present

## 2016-02-17 DIAGNOSIS — E785 Hyperlipidemia, unspecified: Secondary | ICD-10-CM | POA: Insufficient documentation

## 2016-02-17 DIAGNOSIS — I481 Persistent atrial fibrillation: Secondary | ICD-10-CM | POA: Diagnosis not present

## 2016-02-17 DIAGNOSIS — Z7901 Long term (current) use of anticoagulants: Secondary | ICD-10-CM | POA: Insufficient documentation

## 2016-02-17 HISTORY — PX: CARDIOVERSION: SHX1299

## 2016-02-17 SURGERY — CARDIOVERSION
Anesthesia: General

## 2016-02-17 MED ORDER — SODIUM CHLORIDE 0.9 % IV SOLN
INTRAVENOUS | Status: DC
  Administered 2016-02-17 (×2): via INTRAVENOUS

## 2016-02-17 MED ORDER — PROPOFOL 10 MG/ML IV BOLUS
INTRAVENOUS | Status: DC | PRN
  Administered 2016-02-17: 100 mg via INTRAVENOUS

## 2016-02-17 MED ORDER — LIDOCAINE HCL (CARDIAC) 20 MG/ML IV SOLN
INTRAVENOUS | Status: DC | PRN
  Administered 2016-02-17: 80 mg via INTRAVENOUS

## 2016-02-17 MED ORDER — METOPROLOL TARTRATE 5 MG/5ML IV SOLN
INTRAVENOUS | Status: DC | PRN
  Administered 2016-02-17: 5 mg via INTRAVENOUS

## 2016-02-17 MED ORDER — METOPROLOL TARTRATE 5 MG/5ML IV SOLN: 5.0000 mg | Freq: Once | INTRAVENOUS | Status: DC

## 2016-02-17 NOTE — Anesthesia Postprocedure Evaluation (Signed)
Anesthesia Post Note  Patient: Elizabeth Sherman  Procedure(s) Performed: Procedure(s) (LRB): CARDIOVERSION (N/A)  Patient location during evaluation: PACU Anesthesia Type: General Level of consciousness: awake and alert Pain management: pain level controlled Vital Signs Assessment: post-procedure vital signs reviewed and stable Respiratory status: spontaneous breathing, nonlabored ventilation and respiratory function stable Cardiovascular status: blood pressure returned to baseline and stable Postop Assessment: no signs of nausea or vomiting Anesthetic complications: no    Last Vitals:  Vitals:   02/17/16 1048 02/17/16 1050  BP: (!) 133/54 (!) 131/55  Pulse: (!) 59 (!) 58  Resp: 18 17  Temp:      Last Pain:  Vitals:   02/17/16 1005  TempSrc: Oral                 Roniesha Hollingshead A

## 2016-02-17 NOTE — CV Procedure (Signed)
    CARDIOVERSION NOTE  Procedure: Electrical Cardioversion Indications:  Atrial Fibrillation  Procedure Details:  Consent: Risks of procedure as well as the alternatives and risks of each were explained to the (patient/caregiver).  Consent for procedure obtained.  Time Out: Verified patient identification, verified procedure, site/side was marked, verified correct patient position, special equipment/implants available, medications/allergies/relevent history reviewed, required imaging and test results available.  Performed  Patient placed on cardiac monitor, pulse oximetry, supplemental oxygen as necessary.  Sedation given: Propofol per anesthesia Pacer pads placed anterior and posterior chest.  Cardioverted 3 time(s).  Cardioverted at 150J, 200J and 200J (with pad pressure) biphasic.  Impression: Findings: Post procedure EKG shows: NSR with PAC's/PVC's Complications: None Patient did tolerate procedure well.  Plan: 1. Successful DCCV after 3 shocks to NSR with PAC's and PVC's. 2. 5 mg IV lopressor given after conversion to help with atrial irritability 3. Continue anticoagulation and follow-up with Rudi Cocoonna Carroll, NP in the a-fib clinic  Time Spent Directly with the Patient:  45 minutes   Chrystie NoseKenneth C. Temica Righetti, MD, [facility] Attending Cardiologist [facility]  Chrystie NoseKenneth C Dametrius Sanjuan 02/17/2016, 10:43 AM

## 2016-02-17 NOTE — Anesthesia Preprocedure Evaluation (Signed)
Anesthesia Evaluation  Patient identified by MRN, date of birth, ID band Patient awake    Reviewed: Allergy & Precautions, NPO status , Patient's Chart, lab work & pertinent test results  Airway Mallampati: II  TM Distance: >3 FB Neck ROM: Full    Dental  (+) Teeth Intact, Dental Advisory Given   Pulmonary    breath sounds clear to auscultation       Cardiovascular hypertension, Pt. on medications + dysrhythmias Atrial Fibrillation  Rhythm:Regular Rate:Normal     Neuro/Psych    GI/Hepatic   Endo/Other    Renal/GU      Musculoskeletal   Abdominal   Peds  Hematology   Anesthesia Other Findings   Reproductive/Obstetrics                             Anesthesia Physical Anesthesia Plan  ASA: II  Anesthesia Plan: General   Post-op Pain Management:    Induction: Intravenous  Airway Management Planned: Mask  Additional Equipment:   Intra-op Plan:   Post-operative Plan:   Informed Consent: I have reviewed the patients History and Physical, chart, labs and discussed the procedure including the risks, benefits and alternatives for the proposed anesthesia with the patient or authorized representative who has indicated his/her understanding and acceptance.   Dental advisory given  Plan Discussed with: CRNA, Anesthesiologist and Surgeon  Anesthesia Plan Comments:         Anesthesia Quick Evaluation

## 2016-02-17 NOTE — Discharge Instructions (Signed)
Electrical Cardioversion °Electrical cardioversion is the delivery of a jolt of electricity to change the rhythm of the heart. Sticky patches or metal paddles are placed on the chest to deliver the electricity from a device. This is done to restore a normal rhythm. A rhythm that is too fast or not regular keeps the heart from pumping well. °Electrical cardioversion is done in an emergency if:  °· There is low or no blood pressure as a result of the heart rhythm.   °· Normal rhythm must be restored as fast as possible to protect the brain and heart from further damage.   °· It may save a life. °Cardioversion may be done for heart rhythms that are not immediately life threatening, such as atrial fibrillation or flutter, in which:  °· The heart is beating too fast or is not regular.   °· Medicine to change the rhythm has not worked.   °· It is safe to wait in order to allow time for preparation. °· Symptoms of the abnormal rhythm are bothersome. °· The risk of stroke and other serious problems can be reduced. °LET YOUR HEALTH CARE PROVIDER KNOW ABOUT:  °· Any allergies you have. °· All medicines you are taking, including vitamins, herbs, eye drops, creams, and over-the-counter medicines. °· Previous problems you or members of your family have had with the use of anesthetics.   °· Any blood disorders you have.   °· Previous surgeries you have had.   °· Medical conditions you have. °RISKS AND COMPLICATIONS  °Generally, this is a safe procedure. However, problems can occur and include:  °· Breathing problems related to the anesthetic used. °· A blood clot that breaks free and travels to other parts of your body. This could cause a stroke or other problems. The risk of this is lowered by use of blood-thinning medicine (anticoagulant) prior to the procedure. °· Cardiac arrest (rare). °BEFORE THE PROCEDURE  °· You may have tests to detect blood clots in your heart and to evaluate heart function.  °· You may start taking  anticoagulants so your blood does not clot as easily.   °· Medicines may be given to help stabilize your heart rate and rhythm. °PROCEDURE °· You will be given medicine through an IV tube to reduce discomfort and make you sleepy (sedative).   °· An electrical shock will be delivered. °AFTER THE PROCEDURE °Your heart rhythm will be watched to make sure it does not change.  °  °This information is not intended to replace advice given to you by your health care provider. Make sure you discuss any questions you have with your health care provider. °  °Document Released: 05/25/2002 Document Revised: 06/25/2014 Document Reviewed: 12/17/2012 °Elsevier Interactive Patient Education ©2016 [employer] ° °

## 2016-02-17 NOTE — H&P (Signed)
     INTERVAL PROCEDURE H&P  History and Physical Interval Note:  02/17/2016 10:19 AM  Elizabeth Sherman has presented today for their planned procedure. The various methods of treatment have been discussed with the patient and family. After consideration of risks, benefits and other options for treatment, the patient has consented to the procedure.  The patients' outpatient history has been reviewed, patient examined, and no change in status from most recent office note within the past 30 days. I have reviewed the patients' chart and labs and will proceed as planned. Questions were answered to the patient's satisfaction.   Chrystie NoseKenneth C. Hilty, MD, [facility] Attending Cardiologist [facility]  Chrystie NoseKenneth C Hilty 02/17/2016, 10:19 AM

## 2016-02-17 NOTE — Transfer of Care (Signed)
Immediate Anesthesia Transfer of Care Note  Patient: Elizabeth Sherman  Procedure(s) Performed: Procedure(s): CARDIOVERSION (N/A)  Patient Location: PACU  Anesthesia Type:General  Level of Consciousness: awake, alert , oriented and patient cooperative  Airway & Oxygen Therapy: Patient Spontanous Breathing and Patient connected to nasal cannula oxygen  Post-op Assessment: Report given to RN and Post -op Vital signs reviewed and stable  Post vital signs: Reviewed and stable  Last Vitals:  Vitals:   02/17/16 1005  BP: (!) 174/98  Pulse: (!) 113  Resp: 18  Temp: 36.5 C    Last Pain:  Vitals:   02/17/16 1005  TempSrc: Oral         Complications: No apparent anesthesia complications

## 2016-02-19 ENCOUNTER — Encounter (HOSPITAL_COMMUNITY): Payer: Self-pay | Admitting: Internal Medicine

## 2016-02-27 ENCOUNTER — Encounter (HOSPITAL_COMMUNITY): Payer: Self-pay | Admitting: Nurse Practitioner

## 2016-02-27 ENCOUNTER — Ambulatory Visit (HOSPITAL_COMMUNITY)
Admission: RE | Admit: 2016-02-27 | Discharge: 2016-02-27 | Disposition: A | Discharge status: Home / Self Care | Payer: Medicare Other | Source: Ambulatory Visit | Attending: Nurse Practitioner | Admitting: Nurse Practitioner

## 2016-02-27 VITALS — BP 118/68 | HR 67 | Ht 66.0 in | Wt 187.0 lb

## 2016-02-27 DIAGNOSIS — I481 Persistent atrial fibrillation: Secondary | ICD-10-CM

## 2016-02-27 DIAGNOSIS — F329 Major depressive disorder, single episode, unspecified: Secondary | ICD-10-CM | POA: Insufficient documentation

## 2016-02-27 DIAGNOSIS — I10 Essential (primary) hypertension: Secondary | ICD-10-CM | POA: Diagnosis not present

## 2016-02-27 DIAGNOSIS — I4891 Unspecified atrial fibrillation: Secondary | ICD-10-CM | POA: Diagnosis present

## 2016-02-27 DIAGNOSIS — E785 Hyperlipidemia, unspecified: Secondary | ICD-10-CM | POA: Insufficient documentation

## 2016-02-27 DIAGNOSIS — I4819 Other persistent atrial fibrillation: Secondary | ICD-10-CM

## 2016-02-27 DIAGNOSIS — Z7901 Long term (current) use of anticoagulants: Secondary | ICD-10-CM | POA: Diagnosis not present

## 2016-02-27 MED ORDER — LOSARTAN POTASSIUM 50 MG PO TABS: 50.0000 mg | ORAL_TABLET | Freq: Every day | ORAL | 6 refills | Status: DC

## 2016-02-27 MED ORDER — DILTIAZEM HCL ER COATED BEADS 120 MG PO CP24: 120.0000 mg | ORAL_CAPSULE | Freq: Every day | ORAL | 6 refills | Status: DC

## 2016-02-27 NOTE — Progress Notes (Addendum)
Patient ID: Elizabeth Sherman, female   DOB: [birth year], 75 y.o.   MRN: [id number]      Primary Care Physician: Maximiano Coss, MD Referring Physician: [facility] f/u   INDIYAH Sherman is a 75 y.o. female with a h/o new onset afib. This was dx at her PCP, 8/4,  picked up at the exam, pt was unaware. She then decided she would go to the ER for her heart to be evaluated. She was asymptomatic. She was given one eliquis for that evening, rx for cardizem, and was referred here.  She is now on cardizem and is in afib at 53 bpm, again states that she feels well and denies any shortness of breath, or fatigue. 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 She does not thinks she snores, no smoking, no alcohol, no large amounts of caffeine.Chadsvasc score of at least 4.  Pt here for f/u and is feeling well. She had one episode of chest pain for which she was evaluated for at [facility] hospital in the ER and it had resolved by the time she reached the ER and has not returned. She is still unaware of afib but would like to attempt cardioversion when she has bee on blood thinner x 3 weeks. No issues with the anticoagulant and is taking regularly.  Returns to the afib clinic 9/11, f/u cardioversion. This was successful and she remains in SR. She thinks she may see slight improvement in SR but no significant change in her overall energy level.Remains on apixaban.  Today, she denies symptoms of palpitations, chest pain, shortness of breath, orthopnea, PND, lower extremity edema, dizziness, presyncope, syncope, or neurologic sequela. The patient is tolerating medications without difficulties and is otherwise without complaint today.   Past Medical History:  Diagnosis Date  . Depression   . Hyperlipidemia   . Hypertension    Past Surgical History:  Procedure Laterality Date  . ABDOMINAL HYSTERECTOMY    . APPENDECTOMY    . CARDIOVERSION N/A 02/17/2016   Procedure: CARDIOVERSION;   Surgeon: Chrystie Nose, MD;  Location: [facility] ENDOSCOPY;  Service: Cardiovascular;  Laterality: N/A;    Current Outpatient Prescriptions  Medication Sig Dispense Refill  . apixaban (ELIQUIS) 5 MG TABS tablet Take 1 tablet (5 mg total) by mouth 2 (two) times daily. 60 tablet 1  . Cholecalciferol (VITAMIN D PO) Take by mouth.    . citalopram (CELEXA) 20 MG tablet Take 1 tablet by mouth every evening.   5  . diltiazem (CARDIZEM CD) 120 MG 24 hr capsule Take 1 capsule (120 mg total) by mouth daily. 30 capsule 6  . losartan (COZAAR) 50 MG tablet Take 1 tablet (50 mg total) by mouth daily. 30 tablet 6  . Omega-3 Fatty Acids (FISH OIL PO) Take 1 capsule by mouth daily.     Marland Kitchen omeprazole (PRILOSEC OTC) 20 MG tablet Take 20 mg by mouth daily.    . simvastatin (ZOCOR) 10 MG tablet Take 10 mg by mouth daily at 6 PM.      No current facility-administered medications for this encounter.     No Known Allergies  Social History   Social History  . Marital status: Single    Spouse name: N/A  . Number of children: N/A  . Years of education: N/A   Occupational History  . Not on file.   Social History Main Topics  . Smoking status: Never Smoker  . Smokeless tobacco: Never Used  .  Alcohol use No  . Drug use: No  . Sexual activity: Not on file   Other Topics Concern  . Not on file   Social History Narrative  . No narrative on file    Family History  Problem Relation Age of Onset  . Heart failure Mother     ROS- All systems are reviewed and negative except as per the HPI above  Physical Exam: Vitals:   02/27/16 1024  BP: 118/68  Pulse: 67  Weight: 187 lb (84.8 kg)  Height: 5\' 6"  (1.676 m)    GEN- The patient is well appearing, alert and oriented x 3 today.   Head- normocephalic, atraumatic Eyes-  Sclera clear, conjunctiva pink Ears- hearing intact Oropharynx- clear Neck- supple, no JVP Lymph- no cervical lymphadenopathy Lungs- Clear to ausculation bilaterally, normal work of  breathing Heart- irregular rate and rhythm, no murmurs, rubs or gallops, PMI not laterally displaced GI- soft, NT, ND, + BS Extremities- no clubbing, cyanosis, or edema MS- no significant deformity or atrophy Skin- no rash or lesion Psych- euthymic mood, full affect Neuro- strength and sensation are intact  EKG-NSR at 67 bpm, Pr int 176 ms, qrs int 76 ms, qtc 448 ms Epic records reviewed  Echo-- Left ventricle: The cavity size was normal. Wall thickness was   increased in a pattern of mild LVH. Systolic function was normal.   The estimated ejection fraction was in the range of 60% to 65%.   Indeterminant diastolic function (atrial fibrillation). Wall   motion was normal; there were no regional wall motion   abnormalities. - Aortic valve: There was no stenosis. - Mitral valve: There was trivial regurgitation. - Left atrium: The atrium was severely dilated, 41 mm. - Right ventricle: The cavity size was normal. Systolic function   was normal. - Right atrium: The atrium was severely dilated. - Pulmonary arteries: No complete TR doppler jet so unable to   estimate PA systolic pressure. - Inferior vena cava: The vessel was normal in size. The   respirophasic diameter changes were in the normal range (>= 50%),   consistent with normal central venous pressure. - Pericardium, extracardiac: Prominent epicardial adipose tissue.  Impressions:  - The patient was in atrial fibrillation. Normal LV size with mild   LV hypertrophy. EF 60-65%. Normal RV size and systolic function.   No significant valvular abnormalities. Severe biatrial   enlargement.  Assessment and Plan:  1. New onset afib, asymptomatic Successful cardioversion Continue cardizem at 180 mg qd  Continue eliquis 5 mg bid, pt does not have medicare D, but has signed up for it, will go into effect 06/2016 Again reminded not to skip any doses of anticoagulant   F/u inafib clinic 3 months  Lupita LeashDonna C. Matthew Folksarroll, ANP-C Afib  Clinic [facility] [address] [place], [place] [address] [phone]

## 2016-03-12 ENCOUNTER — Ambulatory Visit (INDEPENDENT_AMBULATORY_CARE_PROVIDER_SITE_OTHER): Payer: Medicare Other | Admitting: Orthopedic Surgery

## 2016-03-12 VITALS — BP 131/63 | HR 63 | Wt 193.0 lb

## 2016-03-12 DIAGNOSIS — M129 Arthropathy, unspecified: Secondary | ICD-10-CM | POA: Diagnosis not present

## 2016-03-12 DIAGNOSIS — M19079 Primary osteoarthritis, unspecified ankle and foot: Secondary | ICD-10-CM

## 2016-03-12 NOTE — Progress Notes (Signed)
Patient ID: Elizabeth SarkNancy K Castoro, female   DOB: [birth year], 75 y.o.   MRN: [id number]  Chief Complaint  Patient presents with  . Follow-up    right foot pain    HPI Elizabeth Sherman K Ludolph is a [age] y.o. female.  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 Patient is not injection   Review of Systems Review of Systems  Recent history of heart disease Heart palpitations.  Physical Exam BP 131/63   Pulse 63   Wt 193 lb (87.5 kg)   BMI 31.15 kg/m   Tenderness and prominence dorsum of the midfoot on the right. Ankle range of motion normal ankle stable. No foot atrophy. Skin normal. Pulses normal. Sensation normal.  Impression dorsal foot pain secondary to midfoot arthritis  Patient refused injection  Patient agreed to [facility] warm-and-form orthotics

## 2016-03-15 ENCOUNTER — Telehealth (HOSPITAL_COMMUNITY): Payer: Self-pay | Admitting: *Deleted

## 2016-03-15 NOTE — Telephone Encounter (Signed)
I do not know why the fluid pill component was stopped, ? Kidney issues, low blood pressure, PCP may have had good reason to stop. She will need to let PCP know that she is swelling off diuretic and see what they suggest to do.

## 2016-03-15 NOTE — Telephone Encounter (Signed)
Pt called today c/o swelling on both feet and ankles.  Pt stated last time she was seen by her primary they discontinued the fluid pill in her BP med and put her on plain Losartan.  Pt would like to know if Lupita LeashDonna will place her on an as needed fluid pill.   Please advise

## 2016-03-15 NOTE — Telephone Encounter (Signed)
I cld the patient and advised that she should see PCP for this per Lupita Leashonna, as there may be a reason the diuretic was discontinued. Also she was advised to avoid salt.  Pt understood

## 2016-05-28 ENCOUNTER — Encounter (HOSPITAL_COMMUNITY): Payer: Self-pay | Admitting: Nurse Practitioner

## 2016-05-28 ENCOUNTER — Ambulatory Visit (HOSPITAL_COMMUNITY)
Admission: RE | Admit: 2016-05-28 | Discharge: 2016-05-28 | Disposition: A | Discharge status: Home / Self Care | Payer: Medicare Other | Source: Ambulatory Visit | Attending: Nurse Practitioner | Admitting: Nurse Practitioner

## 2016-05-28 VITALS — BP 144/86 | HR 76 | Ht 66.0 in | Wt 191.4 lb

## 2016-05-28 DIAGNOSIS — Z79899 Other long term (current) drug therapy: Secondary | ICD-10-CM | POA: Diagnosis not present

## 2016-05-28 DIAGNOSIS — Z8249 Family history of ischemic heart disease and other diseases of the circulatory system: Secondary | ICD-10-CM | POA: Insufficient documentation

## 2016-05-28 DIAGNOSIS — I4891 Unspecified atrial fibrillation: Secondary | ICD-10-CM | POA: Diagnosis present

## 2016-05-28 DIAGNOSIS — E785 Hyperlipidemia, unspecified: Secondary | ICD-10-CM | POA: Insufficient documentation

## 2016-05-28 DIAGNOSIS — F329 Major depressive disorder, single episode, unspecified: Secondary | ICD-10-CM | POA: Diagnosis not present

## 2016-05-28 DIAGNOSIS — Z9889 Other specified postprocedural states: Secondary | ICD-10-CM | POA: Insufficient documentation

## 2016-05-28 DIAGNOSIS — I10 Essential (primary) hypertension: Secondary | ICD-10-CM | POA: Diagnosis not present

## 2016-05-28 DIAGNOSIS — Z7901 Long term (current) use of anticoagulants: Secondary | ICD-10-CM | POA: Insufficient documentation

## 2016-05-28 NOTE — Progress Notes (Signed)
Patient ID: Elizabeth Sherman, female   DOB: June 14, 1941, 75 y.o.   MRN: [id number]      Primary Care Physician: Maximiano CossHUNGARLAND,JOHN DAVID, MD Referring Physician: [facility] f[facility]u   Elizabeth Sherman is a 75 y.o. female with a h/o new onset afib. This was dx at her PCP, 8/4,  picked up at the exam, pt was unaware. She then decided she would go to the ER for her heart to be evaluated. She was asymptomatic. She was given one eliquis for that evening, rx for cardizem, and was referred here.  She is now on cardizem and is in afib at 53 bpm, again states that she feels well and denies any shortness of breath, or fatigue. 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 She does not thinks she snores, no smoking, no alcohol, no large amounts of caffeine.Chadsvasc score of at least 4.  Pt here for f/u and is feeling well. She had one episode of chest pain for which [facility] and it had resolved by the time she reached the ER and has not returned. She is still unaware of afib but would like to attempt cardioversion when she has bee on blood thinner x 3 weeks. No issues with the anticoagulant and is taking regularly.  Returns to the afib clinic 9/11, f/u cardioversion. This was successful and she remains in SR. She thinks she may see slight improvement in SR but no significant change in her overall energy level.Remains on apixaban.  F/u in a fib clinc 12/11. She has been staying in SR. She feels well. Continues on eliquis, no bleeding issues.  Today, she denies symptoms of palpitations, chest pain, shortness of breath, orthopnea, PND, lower extremity edema, dizziness, presyncope, syncope, or neurologic sequela. The patient is tolerating medications without difficulties and is otherwise without complaint today.   Past Medical History:  Diagnosis Date  . Depression   . Hyperlipidemia   . Hypertension    Past Surgical History:  Procedure Laterality Date    . ABDOMINAL HYSTERECTOMY    . APPENDECTOMY    . CARDIOVERSION N/A 02/17/2016   Procedure: CARDIOVERSION;  Surgeon: Chrystie NoseKenneth C Hilty, MD;  [facility];  Service: Cardiovascular;  Laterality: N/A;    Current Outpatient Prescriptions  Medication Sig Dispense Refill  . apixaban (ELIQUIS) 5 MG TABS tablet Take 1 tablet (5 mg total) by mouth 2 (two) times daily. 60 tablet 1  . Cholecalciferol (VITAMIN D PO) Take by mouth.    . citalopram (CELEXA) 20 MG tablet Take 1 tablet by mouth every evening.   5  . diltiazem (CARDIZEM CD) 120 MG 24 hr capsule Take 1 capsule (120 mg total) by mouth daily. 30 capsule 6  . losartan (COZAAR) 50 MG tablet Take 1 tablet (50 mg total) by mouth daily. 30 tablet 6  . Omega-3 Fatty Acids (FISH OIL PO) Take 1 capsule by mouth daily.     Marland Kitchen. omeprazole (PRILOSEC OTC) 20 MG tablet Take 20 mg by mouth daily.    . simvastatin (ZOCOR) 10 MG tablet Take 10 mg by mouth daily at 6 PM.      No current facility-administered medications for this encounter.     No Known Allergies  Social History   Social History  . Marital status: Single    Spouse name: N/A  . Number of children: N/A  . Years of education: N/A   Occupational History  .  Not on file.   Social History Main Topics  . Smoking status: Never Smoker  . Smokeless tobacco: Never Used  . Alcohol use No  . Drug use: No  . Sexual activity: Not on file   Other Topics Concern  . Not on file   Social History Narrative  . No narrative on file    Family History  Problem Relation Age of Onset  . Heart failure Mother     ROS- All systems are reviewed and negative except as per the HPI above  Physical Exam: Vitals:   05/28/16 1058  BP: (!) 144/86  Pulse: 76  Weight: 191 lb 6.4 oz (86.8 kg)  Height: 5\' 6"  (1.676 m)    GEN- The patient is well appearing, alert and oriented x 3 today.   Head- normocephalic, atraumatic Eyes-  Sclera clear, conjunctiva pink Ears- hearing intact Oropharynx-  clear Neck- supple, no JVP Lymph- no cervical lymphadenopathy Lungs- Clear to ausculation bilaterally, normal work of breathing Heart- regular rate and rhythm, no murmurs, rubs or gallops, PMI not laterally displaced GI- soft, NT, ND, + BS Extremities- no clubbing, cyanosis, or edema MS- no significant deformity or atrophy Skin- no rash or lesion Psych- euthymic mood, full affect Neuro- strength and sensation are intact  EKG-NSR at 76 bpm, Pr int 174 ms, qrs int 84 ms, qtc 443 ms Epic records reviewed  Echo-- Left ventricle: The cavity size was normal. Wall thickness was   increased in a pattern of mild LVH. Systolic function was normal.   The estimated ejection fraction was in the range of 60% to 65%.   Indeterminant diastolic function (atrial fibrillation). Wall   motion was normal; there were no regional wall motion   abnormalities. - Aortic valve: There was no stenosis. - Mitral valve: There was trivial regurgitation. - Left atrium: The atrium was severely dilated, 41 mm. - Right ventricle: The cavity size was normal. Systolic function   was normal. - Right atrium: The atrium was severely dilated. - Pulmonary arteries: No complete TR doppler jet so unable to   estimate PA systolic pressure. - Inferior vena cava: The vessel was normal in size. The   respirophasic diameter changes were in the normal range (>= 50%),   consistent with normal central venous pressure. - Pericardium, extracardiac: Prominent epicardial adipose tissue.  Impressions:  - The patient was in atrial fibrillation. Normal LV size with mild   LV hypertrophy. EF 60-65%. Normal RV size and systolic function.   No significant valvular abnormalities. Severe biatrial   enlargement.  Assessment and Plan:  1. New onset afib, asymptomatic Successful cardioversion, maintaining SR Continue cardizem at 180 mg qd  Continue eliquis 5 mg bid, pt does not have medicare D, but has signed up for it, will go into  effect 06/2016 Again reminded not to skip any doses of anticoagulant   F/u inafib clinic 3 months  Lupita LeashDonna C. Matthew Folksarroll, ANP-C Afib Clinic [facility] [address] [place], [place] [address] [phone]

## 2016-07-13 ENCOUNTER — Other Ambulatory Visit (HOSPITAL_COMMUNITY): Payer: Self-pay | Admitting: *Deleted

## 2016-07-13 MED ORDER — APIXABAN 5 MG PO TABS: 5.0000 mg | ORAL_TABLET | Freq: Two times a day (BID) | ORAL | 6 refills | Status: DC

## 2016-08-27 ENCOUNTER — Inpatient Hospital Stay (HOSPITAL_COMMUNITY): Admission: RE | Admit: 2016-08-27 | Payer: Medicare Other | Source: Ambulatory Visit | Admitting: Nurse Practitioner

## 2016-08-31 ENCOUNTER — Encounter (HOSPITAL_COMMUNITY): Payer: Self-pay | Admitting: Nurse Practitioner

## 2016-08-31 ENCOUNTER — Ambulatory Visit (HOSPITAL_COMMUNITY)
Admission: RE | Admit: 2016-08-31 | Discharge: 2016-08-31 | Disposition: A | Discharge status: Home / Self Care | Payer: Medicare Other | Source: Ambulatory Visit | Attending: Nurse Practitioner | Admitting: Nurse Practitioner

## 2016-08-31 VITALS — BP 122/80 | HR 70 | Ht 66.0 in | Wt 196.0 lb

## 2016-08-31 DIAGNOSIS — I10 Essential (primary) hypertension: Secondary | ICD-10-CM | POA: Insufficient documentation

## 2016-08-31 DIAGNOSIS — Z90710 Acquired absence of both cervix and uterus: Secondary | ICD-10-CM | POA: Insufficient documentation

## 2016-08-31 DIAGNOSIS — Z9889 Other specified postprocedural states: Secondary | ICD-10-CM | POA: Diagnosis not present

## 2016-08-31 DIAGNOSIS — I4891 Unspecified atrial fibrillation: Secondary | ICD-10-CM | POA: Insufficient documentation

## 2016-08-31 DIAGNOSIS — E785 Hyperlipidemia, unspecified: Secondary | ICD-10-CM | POA: Diagnosis not present

## 2016-08-31 DIAGNOSIS — Z8249 Family history of ischemic heart disease and other diseases of the circulatory system: Secondary | ICD-10-CM | POA: Insufficient documentation

## 2016-08-31 DIAGNOSIS — Z79899 Other long term (current) drug therapy: Secondary | ICD-10-CM | POA: Insufficient documentation

## 2016-08-31 DIAGNOSIS — Z7901 Long term (current) use of anticoagulants: Secondary | ICD-10-CM | POA: Insufficient documentation

## 2016-08-31 DIAGNOSIS — F329 Major depressive disorder, single episode, unspecified: Secondary | ICD-10-CM | POA: Insufficient documentation

## 2016-08-31 MED ORDER — DILTIAZEM HCL ER COATED BEADS 120 MG PO CP24: 120.0000 mg | ORAL_CAPSULE | Freq: Every day | ORAL | 2 refills | Status: DC

## 2016-08-31 MED ORDER — LOSARTAN POTASSIUM 50 MG PO TABS: 50.0000 mg | ORAL_TABLET | Freq: Every day | ORAL | 2 refills | Status: DC

## 2016-08-31 NOTE — Progress Notes (Signed)
Patient ID: Elizabeth Sherman, female   DOB: 1941/01/16, 76 y.o.   MRN: [id number]      Primary Care Physician: Maximiano Coss, MD Referring Physician: [facility] f/u   Elizabeth Sherman is a 76 y.o. female with a h/o new onset afib. This was dx at her PCP, 8/4,  picked up at the exam, pt was unaware. She then decided she would go to the ER for her heart to be evaluated. She was asymptomatic. She was given one eliquis for that evening, rx for cardizem, and was referred here.  She is now on cardizem and is in afib at 53 bpm, again states that she feels well and denies any shortness of breath, or fatigue. 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 She does not thinks she snores, no smoking, no alcohol, no large amounts of caffeine.Chadsvasc score of at least 4.  Pt here for f/u and is feeling well. She had one episode of chest pain for which she was evaluated for at [facility] hospital in the ER and it had resolved by the time she reached the ER and has not returned. She is still unaware of afib but would like to attempt cardioversion when she has bee on blood thinner x 3 weeks. No issues with the anticoagulant and is taking regularly.  Returns to the afib clinic 9/11, f/u cardioversion. This was successful and she remains in SR. She thinks she may see slight improvement in SR but no significant change in her overall energy level.Remains on apixaban.  F/u in a fib clinc 12/11. She has been staying in SR. She feels well. Continues on eliquis, no bleeding issues.  F/u in afib clinic 3/20, she has ben staying in SR. No issues with apixaban.  Today, she denies symptoms of palpitations, chest pain, shortness of breath, orthopnea, PND, lower extremity edema, dizziness, presyncope, syncope, or neurologic sequela. The patient is tolerating medications without difficulties and is otherwise without complaint today.   Past Medical History:  Diagnosis Date  . Depression   .  Hyperlipidemia   . Hypertension    Past Surgical History:  Procedure Laterality Date  . ABDOMINAL HYSTERECTOMY    . APPENDECTOMY    . CARDIOVERSION N/A 02/17/2016   Procedure: CARDIOVERSION;  Surgeon: Chrystie Nose, MD;  Location: [facility] ENDOSCOPY;  Service: Cardiovascular;  Laterality: N/A;    Current Outpatient Prescriptions  Medication Sig Dispense Refill  . apixaban (ELIQUIS) 5 MG TABS tablet Take 1 tablet (5 mg total) by mouth 2 (two) times daily. 60 tablet 6  . Cholecalciferol (VITAMIN D PO) Take by mouth.    . citalopram (CELEXA) 20 MG tablet Take 1 tablet by mouth every evening.   5  . diltiazem (CARDIZEM CD) 120 MG 24 hr capsule Take 1 capsule (120 mg total) by mouth daily. 90 capsule 2  . losartan (COZAAR) 50 MG tablet Take 1 tablet (50 mg total) by mouth daily. 90 tablet 2  . Omega-3 Fatty Acids (FISH OIL PO) Take 1 capsule by mouth daily.     Marland Kitchen omeprazole (PRILOSEC OTC) 20 MG tablet Take 20 mg by mouth daily.    . simvastatin (ZOCOR) 10 MG tablet Take 10 mg by mouth daily at 6 PM.      No current facility-administered medications for this encounter.     No Known Allergies  Social History   Social History  . Marital status: Single    Spouse name: N/A  . Number  of children: N/A  . Years of education: N/A   Occupational History  . Not on file.   Social History Main Topics  . Smoking status: Never Smoker  . Smokeless tobacco: Never Used  . Alcohol use No  . Drug use: No  . Sexual activity: Not on file   Other Topics Concern  . Not on file   Social History Narrative  . No narrative on file    Family History  Problem Relation Age of Onset  . Heart failure Mother     ROS- All systems are reviewed and negative except as per the HPI above  Physical Exam: Vitals:   08/31/16 1135  BP: 122/80  Pulse: 70  Weight: 196 lb (88.9 kg)  Height: 5\' 6"  (1.676 m)    GEN- The patient is well appearing, alert and oriented x 3 today.   Head- normocephalic,  atraumatic Eyes-  Sclera clear, conjunctiva pink Ears- hearing intact Oropharynx- clear Neck- supple, no JVP Lymph- no cervical lymphadenopathy Lungs- Clear to ausculation bilaterally, normal work of breathing Heart- regular rate and rhythm, no murmurs, rubs or gallops, PMI not laterally displaced GI- soft, NT, ND, + BS Extremities- no clubbing, cyanosis, or edema MS- no significant deformity or atrophy Skin- no rash or lesion Psych- euthymic mood, full affect Neuro- strength and sensation are intact  EKG-NSR at 76 bpm, Pr int 174 ms, qrs int 84 ms, qtc 443 ms Epic records reviewed  Echo-- Left ventricle: The cavity size was normal. Wall thickness was   increased in a pattern of mild LVH. Systolic function was normal.   The estimated ejection fraction was in the range of 60% to 65%.   Indeterminant diastolic function (atrial fibrillation). Wall   motion was normal; there were no regional wall motion   abnormalities. - Aortic valve: There was no stenosis. - Mitral valve: There was trivial regurgitation. - Left atrium: The atrium was severely dilated, 41 mm. - Right ventricle: The cavity size was normal. Systolic function   was normal. - Right atrium: The atrium was severely dilated. - Pulmonary arteries: No complete TR doppler jet so unable to   estimate PA systolic pressure. - Inferior vena cava: The vessel was normal in size. The   respirophasic diameter changes were in the normal range (>= 50%),   consistent with normal central venous pressure. - Pericardium, extracardiac: Prominent epicardial adipose tissue.  Impressions:  - The patient was in atrial fibrillation. Normal LV size with mild   LV hypertrophy. EF 60-65%. Normal RV size and systolic function.   No significant valvular abnormalities. Severe biatrial   enlargement.  Assessment and Plan:  1. New onset afib, asymptomatic, 01/2016 Successful cardioversion, 02/2016, maintaining SR Continue cardizem at 180 mg  qd  Continue eliquis 5 mg bid   F/u in afib clinic 6 months  Lupita LeashDonna C. Matthew Folksarroll, ANP-C Afib Clinic [facility] [address] [place], [place] [address] [phone]

## 2016-10-05 ENCOUNTER — Other Ambulatory Visit (HOSPITAL_COMMUNITY): Payer: Self-pay | Admitting: *Deleted

## 2016-10-30 ENCOUNTER — Telehealth (HOSPITAL_COMMUNITY): Payer: Self-pay | Admitting: *Deleted

## 2016-10-30 NOTE — Telephone Encounter (Signed)
Pt cld to ask if she could come in today for low BP and fast heart rate.  Per patient when checked her bp was 123/70 and her HR was 89.  I asked pt how she was feeling since these are both normal numbers and raise no red flags at the moment.  Pt stated she feels fine for the most part just slightly dizzy or off balance at times.  She also stated she had appt with PCP today at 2:00 that her sister will drive her to.  I advised her to keep this appt and call us back to let us know how it went and if she feels she still needs to be seen we will look for first available.  Pt understood and thanked me for advice

## 2017-02-07 ENCOUNTER — Other Ambulatory Visit (HOSPITAL_COMMUNITY): Payer: Self-pay | Admitting: Nurse Practitioner

## 2017-02-22 ENCOUNTER — Encounter (HOSPITAL_COMMUNITY): Payer: Self-pay | Admitting: Nurse Practitioner

## 2017-02-22 ENCOUNTER — Ambulatory Visit (HOSPITAL_COMMUNITY)
Admission: RE | Admit: 2017-02-22 | Discharge: 2017-02-22 | Disposition: A | Discharge status: Home / Self Care | Payer: Medicare Other | Source: Ambulatory Visit | Attending: Nurse Practitioner | Admitting: Nurse Practitioner

## 2017-02-22 VITALS — BP 132/74 | HR 92 | Ht 66.0 in | Wt 198.8 lb

## 2017-02-22 DIAGNOSIS — F329 Major depressive disorder, single episode, unspecified: Secondary | ICD-10-CM | POA: Insufficient documentation

## 2017-02-22 DIAGNOSIS — I4891 Unspecified atrial fibrillation: Secondary | ICD-10-CM

## 2017-02-22 DIAGNOSIS — E785 Hyperlipidemia, unspecified: Secondary | ICD-10-CM | POA: Insufficient documentation

## 2017-02-22 DIAGNOSIS — Z7901 Long term (current) use of anticoagulants: Secondary | ICD-10-CM | POA: Insufficient documentation

## 2017-02-22 DIAGNOSIS — Z8249 Family history of ischemic heart disease and other diseases of the circulatory system: Secondary | ICD-10-CM | POA: Diagnosis not present

## 2017-02-22 DIAGNOSIS — Z79899 Other long term (current) drug therapy: Secondary | ICD-10-CM | POA: Insufficient documentation

## 2017-02-22 DIAGNOSIS — Z9889 Other specified postprocedural states: Secondary | ICD-10-CM | POA: Diagnosis not present

## 2017-02-22 DIAGNOSIS — I10 Essential (primary) hypertension: Secondary | ICD-10-CM | POA: Diagnosis not present

## 2017-02-22 DIAGNOSIS — Z90710 Acquired absence of both cervix and uterus: Secondary | ICD-10-CM | POA: Insufficient documentation

## 2017-02-22 MED ORDER — DILTIAZEM HCL ER COATED BEADS 120 MG PO CP24: 120.0000 mg | ORAL_CAPSULE | Freq: Two times a day (BID) | ORAL | 0 refills | Status: DC

## 2017-02-22 MED ORDER — APIXABAN 5 MG PO TABS: 5.0000 mg | ORAL_TABLET | Freq: Two times a day (BID) | ORAL | 6 refills | Status: DC

## 2017-02-22 NOTE — Progress Notes (Signed)
Patient ID: Elizabeth Sherman, female   DOB: August 10, 1940, 76 y.o.   MRN: [id number]      Primary Care Physician: Elizabeth Coss, MD Referring Physician: [facility] f/u   Elizabeth Sherman is a 76 y.o. female with a h/o new onset afib. This was dx at her PCP, 8/4,  picked up at the exam, pt was unaware. She then decided she would go to the ER for her heart to be evaluated. She was asymptomatic. She was given one eliquis for that evening, rx for cardizem, and was referred here.  She is now on cardizem and is in afib at 53 bpm, again states that she feels well and denies any shortness of breath, or fatigue. 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 She does not thinks she snores, no smoking, no alcohol, no large amounts of caffeine.Chadsvasc score of at least 4.  Pt here for f/u and is feeling well. She had one episode of chest pain for which she was evaluated for at [facility] hospital in the ER and it had resolved by the time she reached the ER and has not returned. She is still unaware of afib but would like to attempt cardioversion when she has bee on blood thinner x 3 weeks. No issues with the anticoagulant and is taking regularly.  Returns to the afib clinic 9/11, f/u cardioversion. This was successful and she remains in SR. She thinks she may see slight improvement in SR but no significant change in her overall energy level.Remains on apixaban.  F/u in a fib clinc 12/11. She has been staying in SR. She feels well. Continues on eliquis, no bleeding issues.  F/u in afib clinic 9/7 and she is in afib at 92 bpm. She is unaware. She is under extra stress from her daughter being diagnosed with cancer and is coming with her to the Ca center at [facility] 5 days a week. She continues on eliquis.  Today, she denies symptoms of palpitations, chest pain, shortness of breath, orthopnea, PND, lower extremity edema, dizziness, presyncope, syncope, or neurologic sequela. The patient  is tolerating medications without difficulties and is otherwise without complaint today.   Past Medical History:  Diagnosis Date  . Depression   . Hyperlipidemia   . Hypertension    Past Surgical History:  Procedure Laterality Date  . ABDOMINAL HYSTERECTOMY    . APPENDECTOMY    . CARDIOVERSION N/A 02/17/2016   Procedure: CARDIOVERSION;  Surgeon: Chrystie Nose, MD;  Location: [facility] ENDOSCOPY;  Service: Cardiovascular;  Laterality: N/A;    Current Outpatient Prescriptions  Medication Sig Dispense Refill  . Cholecalciferol (VITAMIN D PO) Take by mouth.    . citalopram (CELEXA) 20 MG tablet Take 1 tablet by mouth every evening.   5  . diltiazem (CARDIZEM CD) 120 MG 24 hr capsule Take 1 capsule (120 mg total) by mouth daily. 90 capsule 2  . ELIQUIS 5 MG TABS tablet TAKE ONE TABLET BY MOUTH TWICE DAILY 60 tablet 6  . losartan (COZAAR) 50 MG tablet Take 1 tablet (50 mg total) by mouth daily. 90 tablet 2  . Omega-3 Fatty Acids (FISH OIL PO) Take 1 capsule by mouth daily.     Marland Kitchen omeprazole (PRILOSEC OTC) 20 MG tablet Take 20 mg by mouth daily.    . simvastatin (ZOCOR) 10 MG tablet Take 10 mg by mouth daily at 6 PM.      No current facility-administered medications for this encounter.  No Known Allergies  Social History   Social History  . Marital status: Single    Spouse name: N/A  . Number of children: N/A  . Years of education: N/A   Occupational History  . Not on file.   Social History Main Topics  . Smoking status: Never Smoker  . Smokeless tobacco: Never Used  . Alcohol use No  . Drug use: No  . Sexual activity: Not on file   Other Topics Concern  . Not on file   Social History Narrative  . No narrative on file    Family History  Problem Relation Age of Onset  . Heart failure Mother     ROS- All systems are reviewed and negative except as per the HPI above  Physical Exam: Vitals:   02/22/17 1107  BP: 132/74  Pulse: 92  Weight: 198 lb 12.8 oz (90.2 kg)    Height: 5\' 6"  (1.676 m)    GEN- The patient is well appearing, alert and oriented x 3 today.   Head- normocephalic, atraumatic Eyes-  Sclera clear, conjunctiva pink Ears- hearing intact Oropharynx- clear Neck- supple, no JVP Lymph- no cervical lymphadenopathy Lungs- Clear to ausculation bilaterally, normal work of breathing Heart-irregular rate and rhythm, no murmurs, rubs or gallops, PMI not laterally displaced GI- soft, NT, ND, + BS Extremities- no clubbing, cyanosis, or edema MS- no significant deformity or atrophy Skin- no rash or lesion Psych- euthymic mood, full affect Neuro- strength and sensation are intact  EKG-afib at 92 bpm, qrs int 80 ms, qtc 413 ms Epic records reviewed  Echo-- Left ventricle: The cavity size was normal. Wall thickness was   increased in a pattern of mild LVH. Systolic function was normal.   The estimated ejection fraction was in the range of 60% to 65%.   Indeterminant diastolic function (atrial fibrillation). Wall   motion was normal; there were no regional wall motion   abnormalities. - Aortic valve: There was no stenosis. - Mitral valve: There was trivial regurgitation. - Left atrium: The atrium was severely dilated, 41 mm. - Right ventricle: The cavity size was normal. Systolic function   was normal. - Right atrium: The atrium was severely dilated. - Pulmonary arteries: No complete TR doppler jet so unable to   estimate PA systolic pressure. - Inferior vena cava: The vessel was normal in size. The   respirophasic diameter changes were in the normal range (>= 50%),   consistent with normal central venous pressure. - Pericardium, extracardiac: Prominent epicardial adipose tissue.  Impressions:  - The patient was in atrial fibrillation. Normal LV size with mild   LV hypertrophy. EF 60-65%. Normal RV size and systolic function.   No significant valvular abnormalities. Severe biatrial   enlargement.  Assessment and Plan:  1. New  onset afib, asymptomatic Successful cardioversion Has been maintaining SR, but in afib today Continue cardizem at 120 mg qd but  increase to bid Continue eliquis 5 mg bid without missed doses  F/u in afib clinic next Wednesday for EKG  Lupita LeashDonna C. Matthew Folksarroll, ANP-C Afib Clinic [facility] [address] [place], [place] [address] [phone]

## 2017-02-22 NOTE — Patient Instructions (Signed)
Increase diltiazem 120 mg twice a day and come back to see us  Wednesday

## 2017-02-25 ENCOUNTER — Ambulatory Visit (HOSPITAL_COMMUNITY): Payer: Medicare Other | Admitting: Nurse Practitioner

## 2017-02-27 ENCOUNTER — Encounter (HOSPITAL_COMMUNITY): Payer: Self-pay | Admitting: Nurse Practitioner

## 2017-02-27 ENCOUNTER — Ambulatory Visit (HOSPITAL_COMMUNITY)
Admission: RE | Admit: 2017-02-27 | Discharge: 2017-02-27 | Disposition: A | Discharge status: Home / Self Care | Payer: Medicare Other | Source: Ambulatory Visit | Attending: Nurse Practitioner | Admitting: Nurse Practitioner

## 2017-02-27 DIAGNOSIS — I4891 Unspecified atrial fibrillation: Secondary | ICD-10-CM | POA: Insufficient documentation

## 2017-02-27 DIAGNOSIS — R9431 Abnormal electrocardiogram [ECG] [EKG]: Secondary | ICD-10-CM | POA: Diagnosis not present

## 2017-02-27 MED ORDER — BUSPIRONE HCL 5 MG PO TABS: 5.0000 mg | ORAL_TABLET | Freq: Three times a day (TID) | ORAL | 0 refills | Status: DC | PRN

## 2017-02-27 NOTE — Patient Instructions (Addendum)
Start Buspar 5 mg, 1 tablet three times a day as needed (every 8 hours as needed).  Caution this medication may cause dizziness or drowsiness.  Please use caution when taking medication.  This was called into your pharmacy

## 2017-02-27 NOTE — Progress Notes (Addendum)
Pt in for repeat EKG since increase in diltiazem.  Due to vomiting the increase in diltiazem was discontinued.  Pt is not taking taking diltiazem 120 once daily.  EKG to be reviewed by Rudi Cocoonna Nailani Full, NP  Pt remains in afib.Last week, presented in afib. Dilt increased to 120 mg bid, but called a few days ago and did not feel well on bid dose. Today HR is 103 bpm. She has been with her daughter this week to get pic line and start chemo, she was so upset to witness this that she vomited that day. She states her nerves are all torn up and feels this is contributing to her being out of rhythm. Asking for something for nerves, conferred with PharmD and will give 30 tabs, no refills, of Buspar 5 mg to use as needed, but no more than  tid. Advised it may make her drowsy or dizzy so not to use if she expects to be out of the house driving.   I will see her back in 2 weeks and plan for cardioversion at that time, if still in afib. She does not want to purse before then.

## 2017-03-13 ENCOUNTER — Encounter (HOSPITAL_COMMUNITY): Payer: Self-pay | Admitting: Nurse Practitioner

## 2017-03-13 ENCOUNTER — Ambulatory Visit (HOSPITAL_COMMUNITY)
Admission: RE | Admit: 2017-03-13 | Discharge: 2017-03-13 | Disposition: A | Discharge status: Home / Self Care | Payer: Medicare Other | Source: Ambulatory Visit | Attending: Nurse Practitioner | Admitting: Nurse Practitioner

## 2017-03-13 VITALS — BP 134/76 | HR 93 | Ht 66.0 in | Wt 195.6 lb

## 2017-03-13 DIAGNOSIS — I481 Persistent atrial fibrillation: Secondary | ICD-10-CM | POA: Diagnosis not present

## 2017-03-13 DIAGNOSIS — I4819 Other persistent atrial fibrillation: Secondary | ICD-10-CM

## 2017-03-13 DIAGNOSIS — E785 Hyperlipidemia, unspecified: Secondary | ICD-10-CM | POA: Diagnosis not present

## 2017-03-13 DIAGNOSIS — F329 Major depressive disorder, single episode, unspecified: Secondary | ICD-10-CM | POA: Insufficient documentation

## 2017-03-13 DIAGNOSIS — I4891 Unspecified atrial fibrillation: Secondary | ICD-10-CM | POA: Insufficient documentation

## 2017-03-13 DIAGNOSIS — I10 Essential (primary) hypertension: Secondary | ICD-10-CM | POA: Insufficient documentation

## 2017-03-13 NOTE — Progress Notes (Signed)
Patient ID: Elizabeth Sherman, female   DOB: 1940-12-05, 76 y.o.   MRN: [id number]      Primary Care Physician: Maximiano Coss, MD Referring Physician: [facility] f/u   Elizabeth Sherman is a 76 y.o. female with a h/o new onset afib. This was dx at her PCP, 8/4,  picked up at the exam, pt was unaware. She then decided she would go to the ER for her heart to be evaluated. She was asymptomatic. She was given one eliquis for that evening, rx for cardizem, and was referred here.  She is now on cardizem and is in afib at 53 bpm, again states that she feels well and denies any shortness of breath, or fatigue. 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 She does not thinks she snores, no smoking, no alcohol, no large amounts of caffeine.Chadsvasc score of at least 4.  Pt here for f/u and is feeling well. She had one episode of chest pain for which she was evaluated for at [facility] hospital in the ER and it had resolved by the time she reached the ER and has not returned. She is still unaware of afib but would like to attempt cardioversion when she has bee on blood thinner x 3 weeks. No issues with the anticoagulant and is taking regularly.  Returns to the afib clinic 9/11, f/u cardioversion. This was successful and she remains in SR. She thinks she may see slight improvement in SR but no significant change in her overall energy level.Remains on apixaban.  F/u in a fib clinc 12/11. She has been staying in SR. She feels well. Continues on eliquis, no bleeding issues.  F/u in afib clinic 9/7 and she is in afib at 92 bpm. She is unaware. She is under extra stress from her daughter being diagnosed with cancer and is coming with her to the Ca center at [facility] 5 days a week. She continues on eliquis.  F/u in the afib clinic, 9/26. She continues with afib, rate controlled. 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 No missed dosed of eliquis.  Today, she denies symptoms of palpitations, chest pain, shortness of breath, orthopnea, PND, lower extremity edema, dizziness, presyncope, syncope, or neurologic sequela. The patient is tolerating medications without difficulties and is otherwise without complaint today.   Past Medical History:  Diagnosis Date  . Depression   . Hyperlipidemia   . Hypertension    Past Surgical History:  Procedure Laterality Date  . ABDOMINAL HYSTERECTOMY    . APPENDECTOMY    . CARDIOVERSION N/A 02/17/2016   Procedure: CARDIOVERSION;  Surgeon: Chrystie Nose, MD;  Location: [facility] ENDOSCOPY;  Service: Cardiovascular;  Laterality: N/A;    Current Outpatient Prescriptions  Medication Sig Dispense Refill  . apixaban (ELIQUIS) 5 MG TABS tablet Take 1 tablet (5 mg total) by mouth 2 (two) times daily. 60 tablet 6  . busPIRone (BUSPAR) 5 MG tablet Take 1 tablet (5 mg total) by mouth 3 (three) times daily as needed. 30 tablet 0  . Cholecalciferol (VITAMIN D PO) Take by mouth.    . diltiazem (CARDIZEM CD) 120 MG 24 hr capsule Take 1 capsule (120 mg total) by mouth 2 (two) times daily. (Patient taking differently: Take 120 mg by mouth daily. ) 180 capsule 0  .  losartan (COZAAR) 50 MG tablet Take 1 tablet (50 mg total) by mouth daily. 90 tablet 2  . Omega-3 Fatty Acids (FISH OIL PO) Take 1 capsule by mouth daily.     Marland Kitchen omeprazole (PRILOSEC OTC) 20 MG tablet Take 20 mg by mouth daily.     No current facility-administered medications for this encounter.     No Known Allergies  Social History   Social History  . Marital status: Single    Spouse name: N/A  . Number of children: N/A  . Years of education: N/A   Occupational History  . Not on file.   Social History Main Topics  . Smoking status: Never Smoker  . Smokeless tobacco: Never Used  . Alcohol use No  . Drug use: No  . Sexual  activity: Not on file   Other Topics Concern  . Not on file   Social History Narrative  . No narrative on file    Family History  Problem Relation Age of Onset  . Heart failure Mother     ROS- All systems are reviewed and negative except as per the HPI above  Physical Exam: Vitals:   03/13/17 1027  BP: 134/76  Pulse: 93  Weight: 195 lb 9.6 oz (88.7 kg)  Height:  (1.676 m)    GEN- The patient is well appearing, alert and oriented x 3 today.   Head- normocephalic, atraumatic Eyes-  Sclera clear, conjunctiva pink Ears- hearing intact Oropharynx- clear Neck- supple, no JVP Lymph- no cervical lymphadenopathy Lungs- Clear to ausculation bilaterally, normal work of breathing Heart-irregular rate and rhythm, no murmurs, rubs or gallops, PMI not laterally displaced GI- soft, NT, ND, + BS Extremities- no clubbing, cyanosis, or edema MS- no significant deformity or atrophy Skin- no rash or lesion Psych- euthymic mood, full affect Neuro- strength and sensation are intact  EKG-afib at 93 bpm, qrs int 74 ms, qtc 432 ms Epic records reviewed  Echo-- Left ventricle: The cavity size was normal. Wall thickness was   increased in a pattern of mild LVH. Systolic function was normal.   The estimated ejection fraction was in the range of 60% to 65%.   Indeterminant diastolic function (atrial fibrillation). Wall   motion was normal; there were no regional wall motion   abnormalities. - Aortic valve: There was no stenosis. - Mitral valve: There was trivial regurgitation. - Left atrium: The atrium was severely dilated, 41 mm. - Right ventricle: The cavity size was normal. Systolic function   was normal. - Right atrium: The atrium was severely dilated. - Pulmonary arteries: No complete TR doppler jet so unable to   estimate PA systolic pressure. - Inferior vena cava: The vessel was normal in size. The   respirophasic diameter changes were in the normal range (>= 50%),    consistent with normal central venous pressure. - Pericardium, extracardiac: Prominent epicardial adipose tissue.  Impressions:  - The patient was in atrial fibrillation. Normal LV size with mild   LV hypertrophy. EF 60-65%. Normal RV size and systolic function.   No significant valvular abnormalities. Severe biatrial   enlargement.  Assessment and Plan:  1. New onset afib, asymptomatic Successful cardioversion Has been maintaining SR, but in afib for the last few weeks, possibly brought on by the stress of her daughter's diagnosis of cancer Continue cardizem at 120 mg bid, may have to decrease to 120 mg daily at time of cardioversion Continue eliquis 5 mg bid without missed doses  Pt will let  me know in the next couple of weeks when she is able to schedule  Lupita Leash C. Matthew Folks Afib Clinic [facility] [address] [place], [place] [address] [phone]

## 2017-03-15 ENCOUNTER — Other Ambulatory Visit (HOSPITAL_COMMUNITY): Payer: Self-pay | Admitting: *Deleted

## 2017-03-17 ENCOUNTER — Emergency Department (HOSPITAL_COMMUNITY)
Admission: EM | Admit: 2017-03-17 | Discharge: 2017-03-17 | Disposition: A | Discharge status: Home / Self Care | Payer: Medicare Other | Attending: Emergency Medicine | Admitting: Emergency Medicine

## 2017-03-17 ENCOUNTER — Emergency Department (HOSPITAL_COMMUNITY): Payer: Medicare Other

## 2017-03-17 ENCOUNTER — Encounter (HOSPITAL_COMMUNITY): Payer: Self-pay

## 2017-03-17 DIAGNOSIS — R05 Cough: Secondary | ICD-10-CM | POA: Diagnosis not present

## 2017-03-17 HISTORY — DX: Unspecified atrial fibrillation: I48.91

## 2017-03-17 NOTE — ED Notes (Signed)
Writer called and spoke to patient.  Writer encouraged patient to come back to ED for concerning chest xray and needing to be seen by an EDP. Patient stated she waited 3 hours and did not want to come back. Stated she would follow up with PCP in the morning.

## 2017-03-17 NOTE — ED Notes (Signed)
Per registation, patient left ED.

## 2017-03-17 NOTE — ED Triage Notes (Addendum)
Reports of non-productive cough/nasal drainage that started yesterday. Denies fever. Patient reports of having heart shocked 03/25/17 due to a-fib. Patient denies any pain or shortness of breath.

## 2017-03-22 ENCOUNTER — Telehealth (HOSPITAL_COMMUNITY): Payer: Self-pay | Admitting: *Deleted

## 2017-03-22 NOTE — Telephone Encounter (Signed)
Patient was scheduled for cardioversion on 10/8 -- currently ill with upper respiratory/bronchitis being treated with antibiotics. Instructed pt cardioversion  Canceled for 10/8 - pt will call when she has fully recovered from illness to have rescheduled.

## 2017-03-25 ENCOUNTER — Inpatient Hospital Stay (HOSPITAL_COMMUNITY): Admission: RE | Admit: 2017-03-25 | Payer: Medicare Other | Source: Ambulatory Visit | Admitting: Nurse Practitioner

## 2017-03-25 ENCOUNTER — Encounter (HOSPITAL_COMMUNITY): Admission: RE | Payer: Self-pay | Source: Ambulatory Visit

## 2017-03-25 ENCOUNTER — Ambulatory Visit (HOSPITAL_COMMUNITY): Admission: RE | Admit: 2017-03-25 | Payer: Medicare Other | Source: Ambulatory Visit | Admitting: Internal Medicine

## 2017-03-25 SURGERY — CARDIOVERSION
Anesthesia: Monitor Anesthesia Care

## 2017-04-04 ENCOUNTER — Ambulatory Visit (HOSPITAL_COMMUNITY): Payer: Medicare Other | Admitting: Nurse Practitioner

## 2017-04-18 ENCOUNTER — Ambulatory Visit (HOSPITAL_COMMUNITY)
Admission: RE | Admit: 2017-04-18 | Discharge: 2017-04-18 | Disposition: A | Discharge status: Home / Self Care | Payer: Medicare Other | Source: Ambulatory Visit | Attending: Nurse Practitioner | Admitting: Nurse Practitioner

## 2017-04-18 ENCOUNTER — Telehealth (HOSPITAL_COMMUNITY): Payer: Self-pay | Admitting: Nurse Practitioner

## 2017-04-18 VITALS — BP 150/72 | HR 96 | Ht 66.0 in | Wt 198.2 lb

## 2017-04-18 DIAGNOSIS — F329 Major depressive disorder, single episode, unspecified: Secondary | ICD-10-CM | POA: Insufficient documentation

## 2017-04-18 DIAGNOSIS — Z79899 Other long term (current) drug therapy: Secondary | ICD-10-CM | POA: Insufficient documentation

## 2017-04-18 DIAGNOSIS — I4891 Unspecified atrial fibrillation: Secondary | ICD-10-CM | POA: Diagnosis present

## 2017-04-18 DIAGNOSIS — I4819 Other persistent atrial fibrillation: Secondary | ICD-10-CM

## 2017-04-18 DIAGNOSIS — Z7901 Long term (current) use of anticoagulants: Secondary | ICD-10-CM | POA: Insufficient documentation

## 2017-04-18 DIAGNOSIS — I10 Essential (primary) hypertension: Secondary | ICD-10-CM | POA: Diagnosis not present

## 2017-04-18 DIAGNOSIS — E785 Hyperlipidemia, unspecified: Secondary | ICD-10-CM | POA: Diagnosis not present

## 2017-04-18 DIAGNOSIS — I481 Persistent atrial fibrillation: Secondary | ICD-10-CM | POA: Diagnosis not present

## 2017-04-18 NOTE — Progress Notes (Signed)
Patient ID: [name], female   DOB: [birth year], 76 y.o.   MRN: [id number]      Primary Care Physician: Maximiano Coss, MD Referring Physician: [facility] f/u   Elizabeth Sherman is a 76 y.o. female with a h/o new onset afib. This was dx at her PCP, 8/4,  picked up at the exam, pt was unaware. She then decided she would go to the ER for her heart to be evaluated. She was asymptomatic. She was given one eliquis for that evening, rx for cardizem, and was referred here.  She is now on cardizem and is in afib at 53 bpm, again states that she feels well and denies any shortness of breath, or fatigue. 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 She does not thinks she snores, no smoking, no alcohol, no large amounts of caffeine.Chadsvasc score of at least 4.  Pt here for f/u and is feeling well. She had one episode of chest pain for which she was evaluated for at [facility] hospital in the ER and it had resolved by the time she reached the ER and has not returned. She is still unaware of afib but would like to attempt cardioversion when she has bee on blood thinner x 3 weeks. No issues with the anticoagulant and is taking regularly.  Returns to the afib clinic 02/27/16, f/u cardioversion. This was successful and she remains in SR. She thinks she may see slight improvement in SR but no significant change in her overall energy level.Remains on apixaban.  F/u in a fib clinc 12/11. She has been staying in SR. She feels well. Continues on eliquis, no bleeding issues.  F/u in afib clinic 02/22/17 and she is in afib at 92 bpm. She is unaware. She is under extra stress from her daughter being diagnosed with cancer and is coming with her to the Ca center at [facility] 5 days a week. She continues on eliquis.  F/u in the afib clinic, 03/13/17. She continues with afib, rate controlled. She is minimally symptomatic with this but would like to undergo another cardioversion, however, when  to schedule it is up in the air, as her schedule is so full transporting her daughter to radiation. No missed dosed of eliquis.  F/u in afib clinic, 04/18/17, pt had been set up for cardioversion several weeks ago but called and cancelled as she developed an URI. She is now getting over it and is back in the afib clinic to further discuss. I am concerned that now she has been in afib for 2 months and her atrium are severly dilated. I am surprised that she stayed in SR as well as she did since last cardioversion about 2 years ago.  Today, she denies symptoms of palpitations, chest pain, shortness of breath, orthopnea, PND, lower extremity edema, dizziness, presyncope, syncope, or neurologic sequela. The patient is tolerating medications without difficulties and is otherwise without complaint today.   Past Medical History:  Diagnosis Date  . A-fib (HCC)   . Depression   . Hyperlipidemia   . Hypertension    Past Surgical History:  Procedure Laterality Date  . ABDOMINAL HYSTERECTOMY    . APPENDECTOMY    . CARDIOVERSION N/A 02/17/2016   Procedure: CARDIOVERSION;  Surgeon: Chrystie Nose, MD;  Location: [facility] ENDOSCOPY;  Service: Cardiovascular;  Laterality: N/A;    Current Outpatient Prescriptions  Medication Sig Dispense Refill  . apixaban (ELIQUIS) 5 MG TABS tablet Take 1  tablet (5 mg total) by mouth 2 (two) times daily. 60 tablet 6  . busPIRone (BUSPAR) 5 MG tablet Take 1 tablet (5 mg total) by mouth 3 (three) times daily as needed. 30 tablet 0  . Cholecalciferol (VITAMIN D PO) Take by mouth.    . diltiazem (CARDIZEM CD) 120 MG 24 hr capsule Take 1 capsule (120 mg total) by mouth 2 (two) times daily. (Patient taking differently: Take 120 mg by mouth daily. ) 180 capsule 0  . losartan (COZAAR) 50 MG tablet Take 1 tablet (50 mg total) by mouth daily. 90 tablet 2  . Omega-3 Fatty Acids (FISH OIL PO) Take 1 capsule by mouth daily.     Marland Kitchen omeprazole (PRILOSEC OTC) 20 MG tablet Take 20 mg by mouth  daily.     No current facility-administered medications for this encounter.     No Known Allergies  Social History   Social History  . Marital status: Single    Spouse name: N/A  . Number of children: N/A  . Years of education: N/A   Occupational History  . Not on file.   Social History Main Topics  . Smoking status: Never Smoker  . Smokeless tobacco: Never Used  . Alcohol use No  . Drug use: No  . Sexual activity: Not on file   Other Topics Concern  . Not on file   Social History Narrative  . No narrative on file    Family History  Problem Relation Age of Onset  . Heart failure Mother     ROS- All systems are reviewed and negative except as per the HPI above  Physical Exam: Vitals:   04/18/17 0926  BP: (!) 150/72  Pulse: 96  Weight: 198 lb 3.2 oz (89.9 kg)  Height: 5\' 6"  (1.676 m)    GEN- The patient is well appearing, alert and oriented x 3 today.   Head- normocephalic, atraumatic Eyes-  Sclera clear, conjunctiva pink Ears- hearing intact Oropharynx- clear Neck- supple, no JVP Lymph- no cervical lymphadenopathy Lungs- Clear to ausculation bilaterally, normal work of breathing Heart-irregular rate and rhythm, no murmurs, rubs or gallops, PMI not laterally displaced GI- soft, NT, ND, + BS Extremities- no clubbing, cyanosis, or edema MS- no significant deformity or atrophy Skin- no rash or lesion Psych- euthymic mood, full affect Neuro- strength and sensation are intact  EKG-afib at 96 bpm, qrs int 74 ms, qtc 404 ms Epic records reviewed  Echo-- Left ventricle: The cavity size was normal. Wall thickness was   increased in a pattern of mild LVH. Systolic function was normal.   The estimated ejection fraction was in the range of 60% to 65%.   Indeterminant diastolic function (atrial fibrillation). Wall   motion was normal; there were no regional wall motion   abnormalities. - Aortic valve: There was no stenosis. - Mitral valve: There was trivial  regurgitation. - Left atrium: The atrium was severely dilated, 41 mm, diameter, LA volume 103 ml - Right ventricle: The cavity size was normal. Systolic function   was normal. - Right atrium: The atrium was severely dilated. - Pulmonary arteries: No complete TR doppler jet so unable to   estimate PA systolic pressure. - Inferior vena cava: The vessel was normal in size. The   respirophasic diameter changes were in the normal range (>= 50%),   consistent with normal central venous pressure. - Pericardium, extracardiac: Prominent epicardial adipose tissue.  Impressions:  - The patient was in atrial fibrillation. Normal LV size  with mild   LV hypertrophy. EF 60-65%. Normal RV size and systolic function.   No significant valvular abnormalities. Severe biatrial   enlargement.  Assessment and Plan:  1. New onset afib, asymptomatic Successful cardioversion 9/17 Has been maintaining SR, but in afib for the last 2 months possibly brought on by the stress of her daughter's diagnosis of cancer and then cardioversion delayed for development of URI Discussed with pt that with next cardioversion, if AAD therapy notused, she may not shock out or SR may be short lived with the size of her Left atrium We discussed antiarrythmic's available and she was interested in flecainide, but will need a lexi stress test first  Continue cardizem at 120 mg bid  Continue eliquis 5 mg bid without missed doses  After seeing results of stress test then can proceed to initiate Tikosyn.  Elvina Sidleonna C. Matthew Folksarroll, ANP-C Afib Clinic [facility] [address] [place], [place] [address] [phone]

## 2017-04-22 ENCOUNTER — Telehealth (HOSPITAL_COMMUNITY): Payer: Self-pay | Admitting: *Deleted

## 2017-04-22 NOTE — Telephone Encounter (Signed)
User: Trina AoGRIFFIN, Fidel Caggiano A Date/time: 04/18/17 10:49 AM  Comment: Called pt and lmsg for her to CB to get scheduled for her myoview.  Context:  Outcome: Left Message  Phone number: [phone] Phone Type: Home Phone  Comm. type: Telephone Call type: Outgoing  Contact: Barnabas ListerHaskins, Rori K Relation to patient: Self

## 2017-04-22 NOTE — Telephone Encounter (Signed)
Patient given detailed instructions per Myocardial Perfusion Study Information Sheet for the test on 04/23/17. Patient notified to arrive 15 minutes early and that it is imperative to arrive on time for appointment to keep from having the test rescheduled.  If you need to cancel or reschedule your appointment, please call the office within 24 hours of your appointment. . Patient verbalized understanding. Elizabeth Sherman Jacqueline    

## 2017-04-23 ENCOUNTER — Ambulatory Visit (HOSPITAL_COMMUNITY): Payer: Medicare Other | Attending: Cardiovascular Disease

## 2017-04-23 DIAGNOSIS — I481 Persistent atrial fibrillation: Secondary | ICD-10-CM

## 2017-04-23 DIAGNOSIS — I4819 Other persistent atrial fibrillation: Secondary | ICD-10-CM

## 2017-04-23 LAB — MYOCARDIAL PERFUSION IMAGING
CHL CUP RESTING HR STRESS: 95 {beats}/min
CSEPPHR: 117 {beats}/min
LVDIAVOL: 60 mL (ref 46–106)
LVSYSVOL: 15 mL
RATE: 0.29
SDS: 0
SRS: 4
SSS: 4
TID: 1

## 2017-04-23 MED ORDER — TECHNETIUM TC 99M TETROFOSMIN IV KIT
32.8000 | PACK | Freq: Once | INTRAVENOUS | Status: AC | PRN
  Administered 2017-04-23: 32.8 via INTRAVENOUS
  Filled 2017-04-23: qty 33

## 2017-04-23 MED ORDER — TECHNETIUM TC 99M TETROFOSMIN IV KIT
9.8000 | PACK | Freq: Once | INTRAVENOUS | Status: AC | PRN
  Administered 2017-04-23: 9.8 via INTRAVENOUS
  Filled 2017-04-23: qty 10

## 2017-04-23 MED ORDER — REGADENOSON 0.4 MG/5ML IV SOLN
0.4000 mg | Freq: Once | INTRAVENOUS | Status: AC
  Administered 2017-04-23: 0.4 mg via INTRAVENOUS

## 2017-04-26 ENCOUNTER — Other Ambulatory Visit (HOSPITAL_COMMUNITY): Payer: Self-pay | Admitting: *Deleted

## 2017-04-26 MED ORDER — FLECAINIDE ACETATE 50 MG PO TABS: 50.0000 mg | ORAL_TABLET | Freq: Two times a day (BID) | ORAL | 3 refills | Status: DC

## 2017-05-01 ENCOUNTER — Ambulatory Visit (HOSPITAL_COMMUNITY): Payer: Medicare Other | Admitting: Nurse Practitioner

## 2017-05-02 ENCOUNTER — Encounter (HOSPITAL_COMMUNITY): Payer: Self-pay | Admitting: Nurse Practitioner

## 2017-05-02 ENCOUNTER — Ambulatory Visit (HOSPITAL_COMMUNITY)
Admission: RE | Admit: 2017-05-02 | Discharge: 2017-05-02 | Disposition: A | Discharge status: Home / Self Care | Payer: Medicare Other | Source: Ambulatory Visit | Attending: Nurse Practitioner | Admitting: Nurse Practitioner

## 2017-05-02 VITALS — BP 148/86 | HR 87 | Ht 66.0 in | Wt 198.4 lb

## 2017-05-02 DIAGNOSIS — Z79899 Other long term (current) drug therapy: Secondary | ICD-10-CM | POA: Insufficient documentation

## 2017-05-02 DIAGNOSIS — E785 Hyperlipidemia, unspecified: Secondary | ICD-10-CM | POA: Insufficient documentation

## 2017-05-02 DIAGNOSIS — I4891 Unspecified atrial fibrillation: Secondary | ICD-10-CM | POA: Diagnosis present

## 2017-05-02 DIAGNOSIS — Z9889 Other specified postprocedural states: Secondary | ICD-10-CM | POA: Diagnosis not present

## 2017-05-02 DIAGNOSIS — I10 Essential (primary) hypertension: Secondary | ICD-10-CM | POA: Insufficient documentation

## 2017-05-02 DIAGNOSIS — I481 Persistent atrial fibrillation: Secondary | ICD-10-CM | POA: Diagnosis not present

## 2017-05-02 DIAGNOSIS — Z7901 Long term (current) use of anticoagulants: Secondary | ICD-10-CM | POA: Insufficient documentation

## 2017-05-02 DIAGNOSIS — I4819 Other persistent atrial fibrillation: Secondary | ICD-10-CM

## 2017-05-02 DIAGNOSIS — F329 Major depressive disorder, single episode, unspecified: Secondary | ICD-10-CM | POA: Insufficient documentation

## 2017-05-02 NOTE — Progress Notes (Signed)
Patient ID: Elizabeth Sherman, female   DOB: 06/15/1941, 76 y.o.   MRN: [id number]      Primary Care Physician: Elizabeth Sherman, Elizabeth David, MD Referring Physician: [facility]   Elizabeth Sherman is a 76 y.o. female with a h/o new onset afib. This was dx at her PCP, 8/4,  picked up at the exam, pt was unaware. She then decided she would go to the ER for her heart to be evaluated. She was asymptomatic. She was given one eliquis for that evening, rx for cardizem, and was referred here.  She is now on cardizem and is in afib at 53 bpm, again states that she feels well and denies any shortness of breath, or fatigue. 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 She does not thinks she snores, no smoking, no alcohol, no large amounts of caffeine.Chadsvasc score of at least 4.  Pt here for Sherman and is feeling well. She had one episode of chest pain for which she was evaluated for at [facility] in the ER and it had resolved by the time she reached the ER and has not returned. She is still unaware of afib but would like to attempt cardioversion when she has bee on blood thinner x 3 weeks. No issues with the anticoagulant and is taking regularly.  Returns to the afib clinic 02/27/16, Sherman cardioversion. This was successful and she remains in SR. She thinks she may see slight improvement in SR but no significant change in her overall energy level.Remains on apixaban.  Sherman in a fib clinc 12/11. She has been staying in SR. She feels well. Continues on eliquis, no bleeding issues.  Sherman in afib clinic 02/22/17 and she is in afib at 92 bpm. She is unaware. She is under extra stress from her daughter being diagnosed with cancer and is coming with her to the [facility] center at [facility] 5 days a week. She continues on eliquis.  Sherman in the afib clinic, 03/13/17. She continues with afib, rate controlled. She is minimally symptomatic with this but would like to undergo another cardioversion, however, when  to schedule it is up in the air, as her schedule is so full transporting her daughter to radiation. No missed dosed of eliquis.  Sherman in afib clinic, 04/18/17, pt had been set up for cardioversion several weeks ago but called and cancelled as she developed an URI. She is now getting over it and is back in the afib clinic to further discuss. I am concerned that now she has been in afib for 2 months and her atrium are severly dilated. I am surprised that she stayed in SR as well as she did since last cardioversion about 2 years ago.  Sherman in afib clinic 11/15, she has been started on flecainide  50 mg bid, after a low risk stress test. EKG shows no interval change but  remains in afib, rate controlled. She is over her URI and is feeling better from that standpoint. No missed doses of eliquis.  Today, she denies symptoms of palpitations, chest pain, shortness of breath, orthopnea, PND, lower extremity edema, dizziness, presyncope, syncope, or neurologic sequela. The patient is tolerating medications without difficulties and is otherwise without complaint today.   Past Medical History:  Diagnosis Date  . A-fib (HCC)   . Depression   . Hyperlipidemia   . Hypertension    Past Elizabeth History:  Procedure Laterality Date  . ABDOMINAL HYSTERECTOMY    .  APPENDECTOMY    . CARDIOVERSION N/A 02/17/2016   Procedure: CARDIOVERSION;  Surgeon: Chrystie Nose, MD;  Location: [facility] ENDOSCOPY;  Service: Cardiovascular;  Laterality: N/A;    Current Outpatient Medications  Medication Sig Dispense Refill  . apixaban (ELIQUIS) 5 MG TABS tablet Take 1 tablet (5 mg total) by mouth 2 (two) times daily. 60 tablet 6  . Cholecalciferol (VITAMIN D PO) Take by mouth.    . diltiazem (CARDIZEM CD) 120 MG 24 hr capsule Take 1 capsule (120 mg total) by mouth 2 (two) times daily. (Patient taking differently: Take 120 mg by mouth daily. ) 180 capsule 0  . flecainide (TAMBOCOR) 50 MG tablet Take 1 tablet (50 mg total) 2 (two) times  daily by mouth. 60 tablet 3  . losartan (COZAAR) 50 MG tablet Take 1 tablet (50 mg total) by mouth daily. 90 tablet 2  . Omega-3 Fatty Acids (FISH OIL PO) Take 1 capsule by mouth daily.     Marland Kitchen omeprazole (PRILOSEC OTC) 20 MG tablet Take 20 mg by mouth daily.    . busPIRone (BUSPAR) 5 MG tablet Take 1 tablet (5 mg total) by mouth 3 (three) times daily as needed. (Patient not taking: Reported on 05/02/2017) 30 tablet 0   No current facility-administered medications for this encounter.     No Known Allergies  Social History   Socioeconomic History  . Marital status: Single    Spouse name: Not on file  . Number of children: Not on file  . Years of education: Not on file  . Highest education level: Not on file  Social Needs  . Financial resource strain: Not on file  . Food insecurity - worry: Not on file  . Food insecurity - inability: Not on file  . Transportation needs - medical: Not on file  . Transportation needs - non-medical: Not on file  Occupational History  . Not on file  Tobacco Use  . Smoking status: Never Smoker  . Smokeless tobacco: Never Used  Substance and Sexual Activity  . Alcohol use: No  . Drug use: No  . Sexual activity: Not on file  Other Topics Concern  . Not on file  Social History Narrative  . Not on file    Family History  Problem Relation Age of Onset  . Heart failure Mother     ROS- All systems are reviewed and negative except as per the HPI above  Physical Exam: Vitals:   05/02/17 1517  BP: (!) 148/86  Pulse: 87  Weight: 198 lb 6.4 oz (90 kg)  Height: 5\' 6"  (1.676 m)    GEN- The patient is well appearing, alert and oriented x 3 today.   Head- normocephalic, atraumatic Eyes-  Sclera clear, conjunctiva pink Ears- hearing intact Oropharynx- clear Neck- supple, no JVP Lymph- no cervical lymphadenopathy Lungs- Clear to ausculation bilaterally, normal work of breathing Heart-irregular rate and rhythm, no murmurs, rubs or gallops, PMI not  laterally displaced GI- soft, NT, ND, + BS Extremities- no clubbing, cyanosis, or edema MS- no significant deformity or atrophy Skin- no rash or lesion Psych- euthymic mood, full affect Neuro- strength and sensation are intact  EKG-afib at 87 bpm, qrs int 84 ms, qtc 450 ms Epic records reviewed  Echo--8/17- Left ventricle: The cavity size was normal. Wall thickness was   increased in a pattern of mild LVH. Systolic function was normal.   The estimated ejection fraction was in the range of 60% to 65%.   Indeterminant diastolic function (  atrial fibrillation). Wall   motion was normal; there were no regional wall motion   abnormalities. - Aortic valve: There was no stenosis. - Mitral valve: There was trivial regurgitation. - Left atrium: The atrium was severely dilated, 41 mm, diameter, LA volume 103 ml - Right ventricle: The cavity size was normal. Systolic function   was normal. - Right atrium: The atrium was severely dilated. - Pulmonary arteries: No complete TR doppler jet so unable to   estimate PA systolic pressure. - Inferior vena cava: The vessel was normal in size. The   respirophasic diameter changes were in the normal range (>= 50%),   consistent with normal central venous pressure. - Pericardium, extracardiac: Prominent epicardial adipose tissue.  Impressions:  - The patient was in atrial fibrillation. Normal LV size with mild   LV hypertrophy. EF 60-65%. Normal RV size and systolic function.   No significant valvular abnormalities. Severe biatrial   enlargement.   Stress test-Nuclear stress EF: 74%.  There was no ST segment deviation noted during stress.  The study is normal.  The left ventricular ejection fraction is hyperdynamic (>65%).   1. EF 74%, normal wall motion.  2. No evidence for ischemia or infarction on perfusion images.   Normal study.   Assessment and Plan:  1. New onset afib, asymptomatic Successful cardioversion 02/2016 Has been  maintaining SR, but in afib for the last 2 months possibly brought on by the stress of her daughter's diagnosis of cancer and then cardioversion delayed for development of URI Normal stress test and pt has been started on flecainide 50 mg bid, continues in Afib Will increase flecainide to 100 mg bid Saturday 11/24 and plan on cardioversion 1127  Discussed with pt that with next cardioversion, even with  AAD, she may not shock out or SR may be short lived with the size of her Left atrium Continue cardizem at 120 mg bid  Continue eliquis 5 mg bid, no missed doses  Will be seen in office 11/27, prior to cardioversion for repeat EKG and stat labs    Lupita LeashDonna C. Matthew Folksarroll, ANP-C [facility] [address] [facility] [address] [place], [place] [address] [phone]

## 2017-05-02 NOTE — Patient Instructions (Signed)
Your physician has recommended you make the following change in your medication:  1)On Saturday November 24th increase flecainide to 100mg  twice a day  Cardioversion scheduled for Tuesday, November 27th  - Come to clinic at 11am  - [facility] at the [facility] and go to admitting at 11:30am  -Do not eat or drink anything after midnight the night prior to your procedure.  - Take all your medication with a sip of water prior to arrival.  - You will not be able to drive home after your procedure.

## 2017-05-14 ENCOUNTER — Ambulatory Visit (HOSPITAL_COMMUNITY): Payer: Medicare Other | Admitting: Anesthesiology

## 2017-05-14 ENCOUNTER — Ambulatory Visit (HOSPITAL_COMMUNITY)
Admission: RE | Admit: 2017-05-14 | Discharge: 2017-05-14 | Disposition: A | Discharge status: Home / Self Care | Payer: Medicare Other | Source: Ambulatory Visit | Attending: Nurse Practitioner | Admitting: Nurse Practitioner

## 2017-05-14 ENCOUNTER — Encounter (HOSPITAL_COMMUNITY)
Admission: RE | Disposition: A | Discharge status: Home / Self Care | Payer: Self-pay | Source: Ambulatory Visit | Attending: Cardiology

## 2017-05-14 ENCOUNTER — Ambulatory Visit (HOSPITAL_COMMUNITY)
Admission: RE | Admit: 2017-05-14 | Discharge: 2017-05-14 | Disposition: A | Discharge status: Home / Self Care | Payer: Medicare Other | Source: Ambulatory Visit | Attending: Cardiology | Admitting: Cardiology

## 2017-05-14 ENCOUNTER — Encounter (HOSPITAL_COMMUNITY): Payer: Self-pay | Admitting: *Deleted

## 2017-05-14 DIAGNOSIS — I10 Essential (primary) hypertension: Secondary | ICD-10-CM | POA: Insufficient documentation

## 2017-05-14 DIAGNOSIS — I4891 Unspecified atrial fibrillation: Secondary | ICD-10-CM | POA: Insufficient documentation

## 2017-05-14 HISTORY — PX: CARDIOVERSION: SHX1299

## 2017-05-14 LAB — BASIC METABOLIC PANEL
Anion gap: 6 (ref 5–15)
BUN: 20 mg/dL (ref 6–20)
CHLORIDE: 111 mmol/L (ref 101–111)
CO2: 23 mmol/L (ref 22–32)
CREATININE: 0.99 mg/dL (ref 0.44–1.00)
Calcium: 9 mg/dL (ref 8.9–10.3)
GFR calc Af Amer: >60 mL/min (ref >60)
GFR calc non Af Amer: 54 mL/min — ABNORMAL LOW (ref >60)
Glucose, Bld: 115 mg/dL — ABNORMAL HIGH (ref 65–99)
Potassium: 4.4 mmol/L (ref 3.5–5.1)
SODIUM: 140 mmol/L (ref 135–145)

## 2017-05-14 LAB — CBC
HEMATOCRIT: 39 % (ref 36.0–46.0)
HEMOGLOBIN: 12.6 g/dL (ref 12.0–15.0)
MCH: 26 pg (ref 26.0–34.0)
MCHC: 32.3 g/dL (ref 30.0–36.0)
MCV: 80.6 fL (ref 78.0–100.0)
Platelets: 257 10*3/uL (ref 150–400)
RBC: 4.84 MIL/uL (ref 3.87–5.11)
RDW: 15 % (ref 11.5–15.5)
WBC: 7.7 10*3/uL (ref 4.0–10.5)

## 2017-05-14 SURGERY — CARDIOVERSION
Anesthesia: General

## 2017-05-14 MED ORDER — PROPOFOL 10 MG/ML IV BOLUS
INTRAVENOUS | Status: DC | PRN
  Administered 2017-05-14: 70 mg via INTRAVENOUS

## 2017-05-14 MED ORDER — LIDOCAINE 2% (20 MG/ML) 5 ML SYRINGE
INTRAMUSCULAR | Status: DC | PRN
  Administered 2017-05-14: 100 mg via INTRAVENOUS

## 2017-05-14 MED ORDER — SODIUM CHLORIDE 0.9 % IV SOLN
INTRAVENOUS | Status: DC | PRN
  Administered 2017-05-14: 12:00:00 via INTRAVENOUS

## 2017-05-14 NOTE — Procedures (Signed)
Electrical Cardioversion Procedure Note Elizabeth Sherman K Hinostroza [id number] 02-07-1941  Procedure: Electrical Cardioversion Indications:  Atrial Fibrillation  Procedure Details Consent: Risks of procedure as well as the alternatives and risks of each were explained to the (patient/caregiver).  Consent for procedure obtained. Time Out: Verified patient identification, verified procedure, site/side was marked, verified correct patient position, special equipment/implants available, medications/allergies/relevent history reviewed, required imaging and test results available.  Performed  Patient placed on cardiac monitor, pulse oximetry, supplemental oxygen as necessary.  Sedation given: Propofol per anesthesiology Pacer pads placed anterior and posterior chest.  Cardioverted 1 time(s).  Cardioverted at 200J.  Evaluation Findings: Post procedure EKG shows: NSR Complications: None Patient did tolerate procedure well.   Marca AnconaDalton Anastazia Creek 05/14/2017, 12:35 PM

## 2017-05-14 NOTE — H&P (View-Only) (Signed)
Pt in for EKG/labs before DCCV.  To be reviewed by Rivka Saferonna Carroll,NP  Remains in afib at 116 bpm, for cardioversion this am. States no missed dose of anticoagulation.Labs drawn. Will need EKG in SR after cardioversion for interval lengths with recent addition flecainide.

## 2017-05-14 NOTE — Anesthesia Preprocedure Evaluation (Signed)
Anesthesia Evaluation  Patient identified by MRN, date of birth, ID band Patient awake    Reviewed: Allergy & Precautions, NPO status , Patient's Chart, lab work & pertinent test results  Airway Mallampati: II  TM Distance: >3 FB Neck ROM: Full    Dental no notable dental hx.    Pulmonary neg pulmonary ROS,    Pulmonary exam normal breath sounds clear to auscultation       Cardiovascular hypertension, Pt. on medications Normal cardiovascular exam+ dysrhythmias Atrial Fibrillation  Rhythm:Regular Rate:Normal     Neuro/Psych negative neurological ROS  negative psych ROS   GI/Hepatic negative GI ROS, Neg liver ROS,   Endo/Other  negative endocrine ROS  Renal/GU negative Renal ROS  negative genitourinary   Musculoskeletal negative musculoskeletal ROS (+)   Abdominal   Peds negative pediatric ROS (+)  Hematology negative hematology ROS (+)   Anesthesia Other Findings   Reproductive/Obstetrics negative OB ROS                             Anesthesia Physical Anesthesia Plan  ASA: III  Anesthesia Plan: General   Post-op Pain Management:    Induction: Intravenous  PONV Risk Score and Plan: 0 and Treatment may vary due to age or medical condition  Airway Management Planned: Simple Face Mask  Additional Equipment:   Intra-op Plan:   Post-operative Plan:   Informed Consent: I have reviewed the patients History and Physical, chart, labs and discussed the procedure including the risks, benefits and alternatives for the proposed anesthesia with the patient or authorized representative who has indicated his/her understanding and acceptance.   Dental advisory given  Plan Discussed with: CRNA  Anesthesia Plan Comments:         Anesthesia Quick Evaluation

## 2017-05-14 NOTE — Transfer of Care (Signed)
Immediate Anesthesia Transfer of Care Note  Patient: Elizabeth Sherman  Procedure(s) Performed: CARDIOVERSION (N/A )  Patient Location: Endoscopy Unit  Anesthesia Type:General  Level of Consciousness: unresponsive and drowsy  Airway & Oxygen Therapy: Patient Spontanous Breathing  Post-op Assessment: Report given to RN and Post -op Vital signs reviewed and stable  Post vital signs: Reviewed and stable  Last Vitals:  Vitals:   05/14/17 1204  BP: (!) 148/102  Resp: 15  Temp: 36.6 C  SpO2: 98%    Last Pain:  Vitals:   05/14/17 1204  TempSrc: Oral         Complications: No apparent anesthesia complications

## 2017-05-14 NOTE — Progress Notes (Addendum)
Pt in for EKG/labs before DCCV.  To be reviewed by Donna Carroll,NP  Remains in afib at 116 bpm, for cardioversion this am. States no missed dose of anticoagulation.Labs drawn. Will need EKG in SR after cardioversion for interval lengths with recent addition flecainide. 

## 2017-05-14 NOTE — Interval H&P Note (Signed)
History and Physical Interval Note:  05/14/2017 12:33 PM  Satira SarkNancy K Montag  has presented today for surgery, with the diagnosis of AFIB  The various methods of treatment have been discussed with the patient and family. After consideration of risks, benefits and other options for treatment, the patient has consented to  Procedure(s): CARDIOVERSION (N/A) as a surgical intervention .  The patient's history has been reviewed, patient examined, no change in status, stable for surgery.  I have reviewed the patient's chart and labs.  Questions were answered to the patient's satisfaction.     Julies Carmickle [employer]

## 2017-05-14 NOTE — Discharge Instructions (Signed)
Electrical Cardioversion, Care After °This sheet gives you information about how to care for yourself after your procedure. Your health care provider may also give you more specific instructions. If you have problems or questions, contact your health care provider. °What can I expect after the procedure? °After the procedure, it is common to have: °· Some redness on the skin where the shocks were given. ° °Follow these instructions at home: °· Do not drive for 24 hours if you were given a medicine to help you relax (sedative). °· Take over-the-counter and prescription medicines only as told by your health care provider. °· Ask your health care provider how to check your pulse. Check it often. °· Rest for 48 hours after the procedure or as told by your health care provider. °· Avoid or limit your caffeine use as told by your health care provider. °Contact a health care provider if: °· You feel like your heart is beating too quickly or your pulse is not regular. °· You have a serious muscle cramp that does not go away. °Get help right away if: °· You have discomfort in your chest. °· You are dizzy or you feel faint. °· You have trouble breathing or you are short of breath. °· Your speech is slurred. °· You have trouble moving an arm or leg on one side of your body. °· Your fingers or toes turn cold or blue. °This information is not intended to replace advice given to you by your health care provider. Make sure you discuss any questions you have with your health care provider. °Document Released: 03/25/2013 Document Revised: 01/06/2016 Document Reviewed: 12/09/2015 °Elsevier Interactive Patient Education © 2018 [employer] ° °

## 2017-05-14 NOTE — Anesthesia Postprocedure Evaluation (Signed)
Anesthesia Post Note  Patient: Elizabeth SarkNancy K Capo  Procedure(s) Performed: CARDIOVERSION (N/A )     Patient location during evaluation: Endoscopy Anesthesia Type: General Level of consciousness: awake and alert Pain management: pain level controlled Vital Signs Assessment: post-procedure vital signs reviewed and stable Respiratory status: spontaneous breathing, nonlabored ventilation, respiratory function stable and patient connected to nasal cannula oxygen Cardiovascular status: blood pressure returned to baseline and stable Postop Assessment: no apparent nausea or vomiting Anesthetic complications: no    Last Vitals:  Vitals:   05/14/17 1250 05/14/17 1300  BP: 137/64 (!) 123/49  Pulse: 74 74  Resp: 15 14  Temp:    SpO2: 97% 98%    Last Pain:  Vitals:   05/14/17 1241  TempSrc: Oral                 Phillips Groutarignan, Delphine Sizemore

## 2017-05-16 ENCOUNTER — Other Ambulatory Visit (HOSPITAL_COMMUNITY): Payer: Self-pay | Admitting: *Deleted

## 2017-05-16 MED ORDER — DILTIAZEM HCL ER COATED BEADS 120 MG PO CP24: 120.0000 mg | ORAL_CAPSULE | Freq: Two times a day (BID) | ORAL | 0 refills | Status: DC

## 2017-05-16 MED ORDER — FLECAINIDE ACETATE 100 MG PO TABS: 100.0000 mg | ORAL_TABLET | Freq: Two times a day (BID) | ORAL | 3 refills | Status: DC

## 2017-05-27 ENCOUNTER — Ambulatory Visit (HOSPITAL_COMMUNITY): Payer: Medicare Other | Admitting: Nurse Practitioner

## 2017-05-30 ENCOUNTER — Ambulatory Visit (HOSPITAL_COMMUNITY): Payer: Medicare Other | Admitting: Nurse Practitioner

## 2017-06-03 ENCOUNTER — Encounter (HOSPITAL_COMMUNITY): Payer: Self-pay | Admitting: Nurse Practitioner

## 2017-06-03 ENCOUNTER — Ambulatory Visit (HOSPITAL_COMMUNITY)
Admission: RE | Admit: 2017-06-03 | Discharge: 2017-06-03 | Disposition: A | Discharge status: Home / Self Care | Payer: Medicare Other | Source: Ambulatory Visit | Attending: Nurse Practitioner | Admitting: Nurse Practitioner

## 2017-06-03 VITALS — BP 146/78 | HR 85 | Ht 66.0 in | Wt 194.6 lb

## 2017-06-03 DIAGNOSIS — Z90710 Acquired absence of both cervix and uterus: Secondary | ICD-10-CM | POA: Insufficient documentation

## 2017-06-03 DIAGNOSIS — I481 Persistent atrial fibrillation: Secondary | ICD-10-CM | POA: Insufficient documentation

## 2017-06-03 DIAGNOSIS — Z7901 Long term (current) use of anticoagulants: Secondary | ICD-10-CM | POA: Insufficient documentation

## 2017-06-03 DIAGNOSIS — I480 Paroxysmal atrial fibrillation: Secondary | ICD-10-CM

## 2017-06-03 DIAGNOSIS — Z79899 Other long term (current) drug therapy: Secondary | ICD-10-CM | POA: Insufficient documentation

## 2017-06-03 DIAGNOSIS — Z9889 Other specified postprocedural states: Secondary | ICD-10-CM | POA: Diagnosis not present

## 2017-06-03 DIAGNOSIS — E785 Hyperlipidemia, unspecified: Secondary | ICD-10-CM | POA: Insufficient documentation

## 2017-06-03 DIAGNOSIS — Z8249 Family history of ischemic heart disease and other diseases of the circulatory system: Secondary | ICD-10-CM | POA: Diagnosis not present

## 2017-06-03 DIAGNOSIS — I10 Essential (primary) hypertension: Secondary | ICD-10-CM | POA: Insufficient documentation

## 2017-06-03 DIAGNOSIS — F329 Major depressive disorder, single episode, unspecified: Secondary | ICD-10-CM | POA: Insufficient documentation

## 2017-06-03 MED ORDER — FLECAINIDE ACETATE 50 MG PO TABS: 25.0000 mg | ORAL_TABLET | Freq: Two times a day (BID) | ORAL | 3 refills | Status: DC

## 2017-06-03 NOTE — Patient Instructions (Signed)
Your physician has recommended you make the following change in your medication:  1)Decrease flecainide to 25mg  twice a day (1/2 of the 50mg  tab twice a day)  Call Friday with how you are feeling.

## 2017-06-03 NOTE — Progress Notes (Signed)
Patient ID: Elizabeth Sherman, female   DOB: June 12, 1941, 76 y.o.   MRN: [id number]      Primary Care Physician: Maximiano Coss, MD Referring Physician:  ER f/u   Elizabeth Sherman is a 76 y.o. female with a h/o new onset afib 01/14/16. This was dx at her PCP, picked up at the exam, pt was unaware. She then decided she would go to the ER for her heart to be evaluated. She was asymptomatic. She was given one eliquis for that evening, rx for cardizem, and was referred here.  She was seen in the afib clinic for the first time, 01/20/16, by then on cardizem and was in afib at 53 bpm,  stated that she feels well and denies any shortness of breath, or fatigue. 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 She does not thinks she snores, no smoking, no alcohol, no large amounts of caffeine.Chadsvasc score of at least 4.  Pt here for f/u, 8/14/`7 and is feeling well. She had one episode of chest pain for which she was evaluated for at [facility] hospital in the ER, 01/23/16, and it had resolved by the time she reached the ER and has not returned. She is still unaware of afib but would like to attempt cardioversion when she has bee on blood thinner x 3 weeks. No issues with the anticoagulant and is taking regularly.  Returns to the afib clinic 02/27/16, f/u cardioversion. This was successful and she remains in SR. She thinks she may see slight improvement in SR but no significant change in her overall energy level.Remains on apixaban.  F/u in a fib clinc 05/28/16. She has been staying in SR. She feels well. Continues on eliquis, no bleeding issues.  F/u in afib clinic 02/22/17 and she is in afib at 92 bpm. She is unaware. She is under extra stress from her daughter being diagnosed with cancer and is coming with her to the Ca center at [facility] 5 days a week, from [place].Elizabeth Sherman She continues on eliquis.  F/u in the afib clinic, 03/13/17. She continues with afib, rate controlled. She is minimally  symptomatic with this but would like to undergo another cardioversion, however, when to schedule it is up in the air, as her schedule is so full transporting her daughter to radiation. No missed dosed of eliquis.  F/u in afib clinic, 04/18/17, pt had been set up for cardioversion several weeks ago but called and cancelled as she developed an URI. She is now getting over it and is back in the afib clinic to further discuss. I am concerned that now she has been in afib for 2 months and her atrium are severly dilated. I am surprised that she stayed in SR as well as she did since last cardioversion about 2 years ago.  F/u in afib clinic 05/02/17, she has been started on flecainide  50 mg bid, after a low risk stress test. EKG shows no interval change but  remains in afib, rate controlled. She is over her URI and is feeling better from that standpoint. No missed doses of eliquis.  F/u in afib clinic, after successful cardioversion 9/01,but she feels dizzy despite being in SR.Elizabeth Sherman She called office last week with these c/o and flecainide was reduced to 50 mg bid. Ekg shows SR at 85 bpm, intervals OK. Will try to reduce to 25 mg bid and see if symptoms improve. She refuses a flecainide level today.  Today,  she denies symptoms of palpitations, chest pain, shortness of breath, orthopnea, PND, lower extremity edema, dizziness, presyncope, syncope, or neurologic sequela. The patient is tolerating medications without difficulties and is otherwise without complaint today.   Past Medical History:  Diagnosis Date  . A-fib (HCC)   . Depression   . Hyperlipidemia   . Hypertension    Past Surgical History:  Procedure Laterality Date  . ABDOMINAL HYSTERECTOMY    . APPENDECTOMY    . CARDIOVERSION N/A 02/17/2016   Procedure: CARDIOVERSION;  Surgeon: Chrystie NoseKenneth C Hilty, MD;  [facility];  Service: Cardiovascular;  Laterality: N/A;  . CARDIOVERSION N/A 05/14/2017   Procedure: CARDIOVERSION;  Surgeon: Laurey MoraleMcLean,  Dalton S, MD;  [facility];  Service: Cardiovascular;  Laterality: N/A;    Current Outpatient Medications  Medication Sig Dispense Refill  . apixaban (ELIQUIS) 5 MG TABS tablet Take 1 tablet (5 mg total) by mouth 2 (two) times daily. 60 tablet 6  . Cholecalciferol (VITAMIN D3) 2000 units TABS Take 2,000 Units 2 (two) times a week by mouth.    . diltiazem (CARDIZEM CD) 120 MG 24 hr capsule Take 120 mg by mouth daily.    . flecainide (TAMBOCOR) 50 MG tablet Take 0.5 tablets (25 mg total) by mouth 2 (two) times daily. 30 tablet 3  . omeprazole (PRILOSEC OTC) 20 MG tablet Take 20 mg by mouth daily.    . VENTOLIN HFA 108 (90 Base) MCG/ACT inhaler Inhale 2 puffs every 4 (four) hours as needed into the lungs. For wheezing/shortness of breath.     No current facility-administered medications for this encounter.     No Known Allergies  Social History   Socioeconomic History  . Marital status: Single    Spouse name: Not on file  . Number of children: Not on file  . Years of education: Not on file  . Highest education level: Not on file  Social Needs  . Financial resource strain: Not on file  . Food insecurity - worry: Not on file  . Food insecurity - inability: Not on file  . Transportation needs - medical: Not on file  . Transportation needs - non-medical: Not on file  Occupational History  . Not on file  Tobacco Use  . Smoking status: Never Smoker  . Smokeless tobacco: Never Used  Substance and Sexual Activity  . Alcohol use: No  . Drug use: No  . Sexual activity: Not on file  Other Topics Concern  . Not on file  Social History Narrative  . Not on file    Family History  Problem Relation Age of Onset  . Heart failure Mother     ROS- All systems are reviewed and negative except as per the HPI above  Physical Exam: Vitals:   06/03/17 1037  BP: (!) 146/78  Pulse: 85  Weight: 194 lb 9.6 oz (88.3 kg)  Height: 5\' 6"  (1.676 m)    GEN- The patient is well  appearing, alert and oriented x 3 today.   Head- normocephalic, atraumatic Eyes-  Sclera clear, conjunctiva pink Ears- hearing intact Oropharynx- clear Neck- supple, no JVP Lymph- no cervical lymphadenopathy Lungs- Clear to ausculation bilaterally, normal work of breathing Heart-irregular rate and rhythm, no murmurs, rubs or gallops, PMI not laterally displaced GI- soft, NT, ND, + BS Extremities- no clubbing, cyanosis, or edema MS- no significant deformity or atrophy Skin- no rash or lesion Psych- euthymic mood, full affect Neuro- strength and sensation are intact  EKG-NSR at 85 bpm,  pr int 178 ms, qrs int 78 ms, qtc 461 ms Epic records reviewed  Echo--8/17- Left ventricle: The cavity size was normal. Wall thickness was   increased in a pattern of mild LVH. Systolic function was normal.   The estimated ejection fraction was in the range of 60% to 65%.   Indeterminant diastolic function (atrial fibrillation). Wall   motion was normal; there were no regional wall motion   abnormalities. - Aortic valve: There was no stenosis. - Mitral valve: There was trivial regurgitation. - Left atrium: The atrium was severely dilated, 41 mm, diameter, LA volume 103 ml - Right ventricle: The cavity size was normal. Systolic function   was normal. - Right atrium: The atrium was severely dilated. - Pulmonary arteries: No complete TR doppler jet so unable to   estimate PA systolic pressure. - Inferior vena cava: The vessel was normal in size. The   respirophasic diameter changes were in the normal range (>= 50%),   consistent with normal central venous pressure. - Pericardium, extracardiac: Prominent epicardial adipose tissue.  Impressions:  - The patient was in atrial fibrillation. Normal LV size with mild   LV hypertrophy. EF 60-65%. Normal RV size and systolic function.   No significant valvular abnormalities. Severe biatrial   enlargement.   Stress test-Nuclear stress EF: 74%.  There  was no ST segment deviation noted during stress.  The study is normal.  The left ventricular ejection fraction is hyperdynamic (>65%).   1. EF 74%, normal wall motion.  2. No evidence for ischemia or infarction on perfusion images.   Normal study.   Assessment and Plan:  1. Persistent  afib Successful cardioversion 02/2016 Has been maintaining SR,for a year, but in afib for the last 2 months possibly brought on by the stress of her daughter's diagnosis of cancer and then cardioversion delayed for development of URI Normal stress test and pt  started on flecainide 50 mg bid, continues in Afib Will increase flecainide to 100 mg bid Saturday 11/24 and plan on cardioversion 1127  Now with successful cardioversion but with dizziness despite reduction of dose of flecainide to 50 mg bid Will reduce dost to 25 mg bid today, pt refused flecainide level Continue cardizem at 120 mg daily Continue eliquis 5 mg bid, no missed doses, chadsvasc score is at least 4  She will call office on Friday, if still dizzy, will stop drug She will f/u with Dr. Johney FrameAllred to discuss other options if returns to [employer]     Marque Bango C. Matthew Folksarroll, ANP-C Afib Clinic [facility] [address] [place], [place] [id number] [phone]

## 2017-07-09 ENCOUNTER — Other Ambulatory Visit (HOSPITAL_COMMUNITY): Payer: Self-pay | Admitting: Nurse Practitioner

## 2017-08-12 ENCOUNTER — Encounter (HOSPITAL_COMMUNITY): Payer: Self-pay | Admitting: Nurse Practitioner

## 2017-08-12 ENCOUNTER — Ambulatory Visit (HOSPITAL_COMMUNITY)
Admission: RE | Admit: 2017-08-12 | Discharge: 2017-08-12 | Disposition: A | Discharge status: Home / Self Care | Payer: Medicare Other | Source: Ambulatory Visit | Attending: Nurse Practitioner | Admitting: Nurse Practitioner

## 2017-08-12 VITALS — BP 138/64 | HR 67 | Ht 66.0 in | Wt 202.0 lb

## 2017-08-12 DIAGNOSIS — I480 Paroxysmal atrial fibrillation: Secondary | ICD-10-CM | POA: Diagnosis not present

## 2017-08-12 DIAGNOSIS — Z90710 Acquired absence of both cervix and uterus: Secondary | ICD-10-CM | POA: Insufficient documentation

## 2017-08-12 DIAGNOSIS — Z9889 Other specified postprocedural states: Secondary | ICD-10-CM | POA: Insufficient documentation

## 2017-08-12 DIAGNOSIS — Z79899 Other long term (current) drug therapy: Secondary | ICD-10-CM | POA: Diagnosis not present

## 2017-08-12 DIAGNOSIS — I481 Persistent atrial fibrillation: Secondary | ICD-10-CM | POA: Insufficient documentation

## 2017-08-12 DIAGNOSIS — I10 Essential (primary) hypertension: Secondary | ICD-10-CM | POA: Diagnosis not present

## 2017-08-12 NOTE — Progress Notes (Signed)
Patient ID: Elizabeth Sherman, female   DOB: 19-Aug-1940, 77 y.o.   MRN: [id number]      Primary Care Physician: Maximiano CossHungarland, John David, MD Referring Physician:  ER f/u   Elizabeth Sherman is a 77 y.o. female with a h/o new onset afib 01/14/16. This was dx at her PCP, picked up at the exam, pt was unaware. She then decided she would go to the ER for her heart to be evaluated. She was asymptomatic. She was given one eliquis for that evening, rx for cardizem, and was referred here.  She was seen in the afib clinic for the first time, 01/20/16, by then on cardizem and was in afib at 53 bpm,  stated that she feels well and denies any shortness of breath, or fatigue. 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 She does not thinks she snores, no smoking, no alcohol, no large amounts of caffeine.Chadsvasc score of at least 4.  Pt here for f/u, 8/14/`7 and is feeling well. She had one episode of chest pain for which she was [facility], 01/23/16, and it had resolved by the time she reached the ER and has not returned. She is still unaware of afib but would like to attempt cardioversion when she has bee on blood thinner x 3 weeks. No issues with the anticoagulant and is taking regularly.  Returns to the afib clinic 02/27/16, f/u cardioversion. This was successful and she remains in SR. She thinks she may see slight improvement in SR but no significant change in her overall energy level.Remains on apixaban.  F/u in a fib clinc 05/28/16. She has been staying in SR. She feels well. Continues on eliquis, no bleeding issues.  F/u in afib clinic 02/22/17 and she is in afib at 92 bpm. She is unaware. She is under extra stress from her daughter being diagnosed with cancer and is coming with [facility] [place]. She continues on eliquis.  F/u in the afib clinic, 03/13/17. She continues with afib, rate controlled. She is minimally  symptomatic with this but would like to undergo another cardioversion, however, when to schedule it is up in the air, as her schedule is so full transporting her daughter to radiation. No missed dosed of eliquis.  F/u in afib clinic, 04/18/17, pt had been set up for cardioversion several weeks ago but called and cancelled as she developed an URI. She is now getting over it and is back in the afib clinic to further discuss. I am concerned that now she has been in afib for 2 months and her atrium are severly dilated. I am surprised that she stayed in SR as well as she did since last cardioversion about 2 years ago.  F/u in afib clinic 05/02/17, she has been started on flecainide  50 mg bid, after a low risk stress test. EKG shows no interval change but  remains in afib, rate controlled. She is over her URI and is feeling better from that standpoint. No missed doses of eliquis.  F/u in afib clinic, after successful cardioversion 9/01,but she feels dizzy despite being in SR.Marland Kitchen. She called office last week with these c/o and flecainide was reduced to 50 mg bid. Ekg shows SR at 85 bpm, intervals OK. Will try to reduce to 25 mg bid and see if symptoms improve. She refuses a flecainide level today.  F/u  in fib clinic, 2/25. She is staying in SR. She feels well. She called in after cardioversion c/o dizziness and flecainide was reduced to 25 mg bid. This has kept her in SR and eliminated her c/o dizziness.  Today, she denies symptoms of palpitations, chest pain, shortness of breath, orthopnea, PND, lower extremity edema, dizziness, presyncope, syncope, or neurologic sequela. The patient is tolerating medications without difficulties and is otherwise without complaint today.   Past Medical History:  Diagnosis Date  . A-fib (HCC)   . Depression   . Hyperlipidemia   . Hypertension    Past Surgical History:  Procedure Laterality Date  . ABDOMINAL HYSTERECTOMY    . APPENDECTOMY    . CARDIOVERSION N/A 02/17/2016     Procedure: CARDIOVERSION;  Surgeon: Chrystie NoseKenneth C Hilty, MD;  [facility];  Service: Cardiovascular;  Laterality: N/A;  . CARDIOVERSION N/A 05/14/2017   Procedure: CARDIOVERSION;  Surgeon: Laurey MoraleMcLean, Dalton S, MD;  [facility];  Service: Cardiovascular;  Laterality: N/A;    Current Outpatient Medications  Medication Sig Dispense Refill  . apixaban (ELIQUIS) 5 MG TABS tablet Take 1 tablet (5 mg total) by mouth 2 (two) times daily. 60 tablet 6  . Cholecalciferol (VITAMIN D3) 2000 units TABS Take 2,000 Units 2 (two) times a week by mouth.    . diltiazem (CARDIZEM CD) 120 MG 24 hr capsule Take 120 mg by mouth daily.    . flecainide (TAMBOCOR) 50 MG tablet Take 0.5 tablets (25 mg total) by mouth 2 (two) times daily. 30 tablet 3  . losartan (COZAAR) 50 MG tablet TAKE 1 TABLET BY MOUTH ONCE DAILY 90 tablet 2  . omeprazole (PRILOSEC OTC) 20 MG tablet Take 20 mg by mouth daily.    . VENTOLIN HFA 108 (90 Base) MCG/ACT inhaler Inhale 2 puffs every 4 (four) hours as needed into the lungs. For wheezing/shortness of breath.     No current facility-administered medications for this encounter.     No Known Allergies  Social History   Socioeconomic History  . Marital status: Single    Spouse name: Not on file  . Number of children: Not on file  . Years of education: Not on file  . Highest education level: Not on file  Social Needs  . Financial resource strain: Not on file  . Food insecurity - worry: Not on file  . Food insecurity - inability: Not on file  . Transportation needs - medical: Not on file  . Transportation needs - non-medical: Not on file  Occupational History  . Not on file  Tobacco Use  . Smoking status: Never Smoker  . Smokeless tobacco: Never Used  Substance and Sexual Activity  . Alcohol use: No  . Drug use: No  . Sexual activity: Not on file  Other Topics Concern  . Not on file  Social History Narrative  . Not on file    Family History  Problem  Relation Age of Onset  . Heart failure Mother     ROS- All systems are reviewed and negative except as per the HPI above  Physical Exam: Vitals:   08/12/17 0933  BP: 138/64  Pulse: 67  Weight: 202 lb (91.6 kg)  Height: 5\' 6"  (1.676 m)    GEN- The patient is well appearing, alert and oriented x 3 today.   Head- normocephalic, atraumatic Eyes-  Sclera clear, conjunctiva pink Ears- hearing intact Oropharynx- clear Neck- supple, no JVP Lymph- no cervical lymphadenopathy Lungs- Clear to ausculation bilaterally, normal  work of breathing Heart- regular rate and rhythm, no murmurs, rubs or gallops, PMI not laterally displaced GI- soft, NT, ND, + BS Extremities- no clubbing, cyanosis, or edema MS- no significant deformity or atrophy Skin- no rash or lesion Psych- euthymic mood, full affect Neuro- strength and sensation are intact  EKG-NSR with first degree AV block. Pr int at 218 ms, qrs int 88 ms, qtc 435 ms Epic records reviewed  Echo--8/17- Left ventricle: The cavity size was normal. Wall thickness was   increased in a pattern of mild LVH. Systolic function was normal.   The estimated ejection fraction was in the range of 60% to 65%.   Indeterminant diastolic function (atrial fibrillation). Wall   motion was normal; there were no regional wall motion   abnormalities. - Aortic valve: There was no stenosis. - Mitral valve: There was trivial regurgitation. - Left atrium: The atrium was severely dilated, 41 mm, diameter, LA volume 103 ml - Right ventricle: The cavity size was normal. Systolic function   was normal. - Right atrium: The atrium was severely dilated. - Pulmonary arteries: No complete TR doppler jet so unable to   estimate PA systolic pressure. - Inferior vena cava: The vessel was normal in size. The   respirophasic diameter changes were in the normal range (>= 50%),   consistent with normal central venous pressure. - Pericardium, extracardiac: Prominent  epicardial adipose tissue.  Impressions:  - The patient was in atrial fibrillation. Normal LV size with mild   LV hypertrophy. EF 60-65%. Normal RV size and systolic function.   No significant valvular abnormalities. Severe biatrial   enlargement.   Stress test-Nuclear stress EF: 74%.  There was no ST segment deviation noted during stress.  The study is normal.  The left ventricular ejection fraction is hyperdynamic (>65%).   1. EF 74%, normal wall motion.  2. No evidence for ischemia or infarction on perfusion images.   Normal study.   Assessment and Plan:  1. Persistent  afib Successful cardioversion 02/2016 Had been maintaining SR,for a year, but returned to afib, possibly brought on by the stress of her daughter's diagnosis of cancer and then cardioversion delayed for development of URI Normal stress test and pt  started on flecainide 50 mg bid, continues in Afib Successful cardioversion but with dizziness, dose reduction to flecainide  25 mg bid eliminated symptoms and she is staying in SR Continue cardizem at 120 mg daily Continue eliquis 5 mg bid, no missed doses, chadsvasc score is at least 4  F/u in 6 months   Lupita Leash C. Matthew Folks Afib Clinic [facility] [address] [place], [place] [address] [phone]

## 2017-09-07 ENCOUNTER — Other Ambulatory Visit (HOSPITAL_COMMUNITY): Payer: Self-pay | Admitting: Nurse Practitioner

## 2017-10-31 ENCOUNTER — Telehealth (HOSPITAL_COMMUNITY): Payer: Self-pay | Admitting: *Deleted

## 2017-10-31 NOTE — Telephone Encounter (Signed)
Pt cld reporting she has been sick with an URI and her physician put her on Augmentin and Tussionex-chlorperamine.  Pt asked about safety of using this with her flecainide and hx of afib.  Per Pharmacy, the tussionex combo is possibly problematic and she should ask for a cough med without decongestant. According to nursing staff, the augmentin is okay to take.  Pt understood

## 2018-01-28 ENCOUNTER — Other Ambulatory Visit (HOSPITAL_COMMUNITY): Payer: Self-pay | Admitting: *Deleted

## 2018-01-28 MED ORDER — BUSPIRONE HCL 5 MG PO TABS: 5.0000 mg | ORAL_TABLET | Freq: Three times a day (TID) | ORAL | 1 refills | Status: AC | PRN

## 2018-02-03 ENCOUNTER — Ambulatory Visit (HOSPITAL_COMMUNITY)
Admission: RE | Admit: 2018-02-03 | Discharge: 2018-02-03 | Disposition: A | Discharge status: Home / Self Care | Payer: Medicare Other | Source: Ambulatory Visit | Attending: Nurse Practitioner | Admitting: Nurse Practitioner

## 2018-02-03 ENCOUNTER — Encounter (HOSPITAL_COMMUNITY): Payer: Self-pay | Admitting: Nurse Practitioner

## 2018-02-03 VITALS — BP 126/70 | HR 67 | Ht 66.0 in | Wt 203.0 lb

## 2018-02-03 DIAGNOSIS — I481 Persistent atrial fibrillation: Secondary | ICD-10-CM | POA: Diagnosis not present

## 2018-02-03 DIAGNOSIS — I480 Paroxysmal atrial fibrillation: Secondary | ICD-10-CM | POA: Diagnosis not present

## 2018-02-03 DIAGNOSIS — Z90710 Acquired absence of both cervix and uterus: Secondary | ICD-10-CM | POA: Diagnosis not present

## 2018-02-03 DIAGNOSIS — Z79899 Other long term (current) drug therapy: Secondary | ICD-10-CM | POA: Diagnosis not present

## 2018-02-03 DIAGNOSIS — I10 Essential (primary) hypertension: Secondary | ICD-10-CM | POA: Diagnosis not present

## 2018-02-03 DIAGNOSIS — F329 Major depressive disorder, single episode, unspecified: Secondary | ICD-10-CM | POA: Diagnosis not present

## 2018-02-03 DIAGNOSIS — E785 Hyperlipidemia, unspecified: Secondary | ICD-10-CM | POA: Insufficient documentation

## 2018-02-03 DIAGNOSIS — Z8249 Family history of ischemic heart disease and other diseases of the circulatory system: Secondary | ICD-10-CM | POA: Diagnosis not present

## 2018-02-03 DIAGNOSIS — I4891 Unspecified atrial fibrillation: Secondary | ICD-10-CM | POA: Diagnosis present

## 2018-02-03 DIAGNOSIS — Z7901 Long term (current) use of anticoagulants: Secondary | ICD-10-CM | POA: Insufficient documentation

## 2018-02-03 NOTE — Progress Notes (Signed)
Patient ID: Elizabeth Sherman, female   DOB: 19-Aug-1940, 77 y.o.   MRN: [id number]      Primary Care Physician: Maximiano CossHungarland, John David, MD Referring Physician:  ER f/u   Elizabeth Sherman is a 77 y.o. female with a h/o new onset afib 01/14/16. This was dx at her PCP, picked up at the exam, pt was unaware. She then decided she would go to the ER for her heart to be evaluated. She was asymptomatic. She was given one eliquis for that evening, rx for cardizem, and was referred here.  She was seen in the afib clinic for the first time, 01/20/16, by then on cardizem and was in afib at 53 bpm,  stated that she feels well and denies any shortness of breath, or fatigue. 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 She does not thinks she snores, no smoking, no alcohol, no large amounts of caffeine.Chadsvasc score of at least 4.  Pt here for f/u, 8/14/`7 and is feeling well. She had one episode of chest pain for which she [facility], 01/23/16, and it had resolved by the time she reached the ER and has not returned. She is still unaware of afib but would like to attempt cardioversion when she has bee on blood thinner x 3 weeks. No issues with the anticoagulant and is taking regularly.  Returns to the afib clinic 02/27/16, f/u cardioversion. This was successful and she remains in SR. She thinks she may see slight improvement in SR but no significant change in her overall energy level.Remains on apixaban.  F/u in a fib clinc 05/28/16. She has been staying in SR. She feels well. Continues on eliquis, no bleeding issues.  F/u in afib clinic 02/22/17 and she is in afib at 92 bpm. She is unaware. She is under extra stress from her daughter being diagnosed with cancer and is coming with her to the Ca center at [facility] 5 [place] a [place], [place]. She continues on eliquis.  F/u in the afib clinic, 03/13/17. She continues with afib, rate controlled. She is minimally  symptomatic with this but would like to undergo another cardioversion, however, when to schedule it is up in the air, as her schedule is so full transporting her daughter to radiation. No missed dosed of eliquis.  F/u in afib clinic, 04/18/17, pt had been set up for cardioversion several weeks ago but called and cancelled as she developed an URI. She is now getting over it and is back in the afib clinic to further discuss. I am concerned that now she has been in afib for 2 months and her atrium are severly dilated. I am surprised that she stayed in SR as well as she did since last cardioversion about 2 years ago.  F/u in afib clinic 05/02/17, she has been started on flecainide  50 mg bid, after a low risk stress test. EKG shows no interval change but  remains in afib, rate controlled. She is over her URI and is feeling better from that standpoint. No missed doses of eliquis.  F/u in afib clinic, after successful cardioversion 9/01,but she feels dizzy despite being in SR.[place]. She called office last week with these c/o and flecainide was reduced to 50 mg bid. Ekg shows SR at 85 bpm, intervals OK. Will try to reduce to 25 mg bid and see if symptoms improve. She refuses a flecainide level today.  F/u  in fib clinic, 2/25. She is staying in SR. She feels well. She called in after cardioversion c/o dizziness and flecainide was reduced to 25 mg bid. This has kept her in SR and eliminated her c/o dizziness.  F/u in afib clinic 8/19. She is doing well staying in SR on flecainide. Voices no complaints today.  Today, she denies symptoms of palpitations, chest pain, shortness of breath, orthopnea, PND, lower extremity edema, dizziness, presyncope, syncope, or neurologic sequela. The patient is tolerating medications without difficulties and is otherwise without complaint today.   Past Medical History:  Diagnosis Date  . A-fib (HCC)   . Depression   . Hyperlipidemia   . Hypertension    Past Surgical History:    Procedure Laterality Date  . ABDOMINAL HYSTERECTOMY    . APPENDECTOMY    . CARDIOVERSION N/A 02/17/2016   Procedure: CARDIOVERSION;  Surgeon: Chrystie NoseKenneth C Hilty, MD;  [facility];  Service: Cardiovascular;  Laterality: N/A;  . CARDIOVERSION N/A 05/14/2017   Procedure: CARDIOVERSION;  Surgeon: Laurey MoraleMcLean, Dalton S, MD;  [facility];  Service: Cardiovascular;  Laterality: N/A;    Current Outpatient Medications  Medication Sig Dispense Refill  . apixaban (ELIQUIS) 5 MG TABS tablet Take 1 tablet (5 mg total) by mouth 2 (two) times daily. 60 tablet 6  . busPIRone (BUSPAR) 5 MG tablet Take 1 tablet (5 mg total) by mouth 3 (three) times daily as needed. 15 tablet 1  . Cholecalciferol (VITAMIN D3) 2000 units TABS Take 2,000 Units 2 (two) times a week by mouth.    . diltiazem (CARDIZEM CD) 120 MG 24 hr capsule Take 120 mg by mouth daily.    [place]. ELIQUIS 5 MG TABS tablet TAKE 1 TABLET BY MOUTH TWICE DAILY 60 tablet 6  . flecainide (TAMBOCOR) 50 MG tablet Take 0.5 tablets (25 mg total) by mouth 2 (two) times daily. 30 tablet 3  . losartan (COZAAR) 50 MG tablet TAKE 1 TABLET BY MOUTH ONCE DAILY 90 tablet 2  . VENTOLIN HFA 108 (90 Base) MCG/ACT inhaler Inhale 2 puffs every 4 (four) hours as needed into the lungs. For wheezing/shortness of breath.     No current facility-administered medications for this encounter.     No Known Allergies  Social History   Socioeconomic History  . Marital status: Single    Spouse name: Not on file  . Number of children: Not on file  . Years of education: Not on file  . Highest education level: Not on file  Occupational History  . Not on file  Social Needs  . Financial resource strain: Not on file  . Food insecurity:    Worry: Not on file    Inability: Not on file  . Transportation needs:    Medical: Not on file    Non-medical: Not on file  Tobacco Use  . Smoking status: Never Smoker  . Smokeless tobacco: Never Used  Substance and Sexual  Activity  . Alcohol use: No  . Drug use: No  . Sexual activity: Not on file  Lifestyle  . Physical activity:    Days per week: Not on file    Minutes per session: Not on file  . Stress: Not on file  Relationships  . Social connections:    Talks on phone: Not on file    Gets together: Not on file    Attends religious service: Not on file    Active member of club or organization: Not on file    Attends  meetings of clubs or organizations: Not on file    Relationship status: Not on file  . Intimate partner violence:    Fear of current or ex partner: Not on file    Emotionally abused: Not on file    Physically abused: Not on file    Forced sexual activity: Not on file  Other Topics Concern  . Not on file  Social History Narrative  . Not on file    Family History  Problem Relation Age of Onset  . Heart failure Mother     ROS- All systems are reviewed and negative except as per the HPI above  Physical Exam: Vitals:   02/03/18 1100  BP: 126/70  Pulse: 67  Weight: 92.1 kg  Height: 5\' 6"  (1.676 m)    GEN- The patient is well appearing, alert and oriented x 3 today.   Head- normocephalic, atraumatic Eyes-  Sclera clear, conjunctiva pink Ears- hearing intact Oropharynx- clear Neck- supple, no JVP Lymph- no cervical lymphadenopathy Lungs- Clear to ausculation bilaterally, normal work of breathing Heart- regular rate and rhythm, no murmurs, rubs or gallops, PMI not laterally displaced GI- soft, NT, ND, + BS Extremities- no clubbing, cyanosis, or edema MS- no significant deformity or atrophy Skin- no rash or lesion Psych- euthymic mood, full affect Neuro- strength and sensation are intact  EKG-NSR at 67 bpm, IRBBB,  Pr int 208 ms, qrs int 100 ms, qtc 426 ms Epic records reviewed  Echo--8/17- Left ventricle: The cavity size was normal. Wall thickness was   increased in a pattern of mild LVH. Systolic function was normal.   The estimated ejection fraction was in the  range of 60% to 65%.   Indeterminant diastolic function (atrial fibrillation). Wall   motion was normal; there were no regional wall motion   abnormalities. - Aortic valve: There was no stenosis. - Mitral valve: There was trivial regurgitation. - Left atrium: The atrium was severely dilated, 41 mm, diameter, LA volume 103 ml - Right ventricle: The cavity size was normal. Systolic function   was normal. - Right atrium: The atrium was severely dilated. - Pulmonary arteries: No complete TR doppler jet so unable to   estimate PA systolic pressure. - Inferior vena cava: The vessel was normal in size. The   respirophasic diameter changes were in the normal range (>= 50%),   consistent with normal central venous pressure. - Pericardium, extracardiac: Prominent epicardial adipose tissue.  Impressions:  - The patient was in atrial fibrillation. Normal LV size with mild   LV hypertrophy. EF 60-65%. Normal RV size and systolic function.   No significant valvular abnormalities. Severe biatrial   enlargement.   Stress test-Nuclear stress EF: 74%.  There was no ST segment deviation noted during stress.  The study is normal.  The left ventricular ejection fraction is hyperdynamic (>65%).   1. EF 74%, normal wall motion.  2. No evidence for ischemia or infarction on perfusion images.   Normal study.   Assessment and Plan:  1. Persistent  afib Successful cardioversion 02/2016 Had been maintaining SR on flecainide 50 mg, 1/2 tab bid Continue cardizem 120 mg qd Continue eliquis 5 mg bid, no missed doses, chadsvasc score is at least 4 States [place] and were told everything was good  F/u in 6 months   Lupita LeashDonna C. Matthew Folksarroll, ANP-C Afib Clinic [facility] [address] [place], [place] [address] [phone]

## 2018-02-04 ENCOUNTER — Other Ambulatory Visit (HOSPITAL_COMMUNITY): Payer: Self-pay | Admitting: Nurse Practitioner

## 2018-04-10 ENCOUNTER — Other Ambulatory Visit (HOSPITAL_COMMUNITY): Payer: Self-pay | Admitting: Nurse Practitioner

## 2018-04-30 ENCOUNTER — Other Ambulatory Visit (HOSPITAL_COMMUNITY): Payer: Self-pay | Admitting: Nurse Practitioner

## 2018-06-23 ENCOUNTER — Ambulatory Visit (HOSPITAL_COMMUNITY)
Admission: RE | Admit: 2018-06-23 | Discharge: 2018-06-23 | Disposition: A | Discharge status: Home / Self Care | Payer: Medicare Other | Source: Ambulatory Visit | Attending: Nurse Practitioner | Admitting: Nurse Practitioner

## 2018-06-23 ENCOUNTER — Encounter (HOSPITAL_COMMUNITY): Payer: Self-pay | Admitting: Nurse Practitioner

## 2018-06-23 VITALS — BP 114/84 | HR 98 | Ht 66.0 in | Wt 200.6 lb

## 2018-06-23 DIAGNOSIS — Z7901 Long term (current) use of anticoagulants: Secondary | ICD-10-CM | POA: Insufficient documentation

## 2018-06-23 DIAGNOSIS — I4819 Other persistent atrial fibrillation: Secondary | ICD-10-CM | POA: Diagnosis present

## 2018-06-23 DIAGNOSIS — I10 Essential (primary) hypertension: Secondary | ICD-10-CM | POA: Diagnosis not present

## 2018-06-23 DIAGNOSIS — Z79899 Other long term (current) drug therapy: Secondary | ICD-10-CM | POA: Diagnosis not present

## 2018-06-23 DIAGNOSIS — Z90710 Acquired absence of both cervix and uterus: Secondary | ICD-10-CM | POA: Diagnosis not present

## 2018-06-23 DIAGNOSIS — Z8249 Family history of ischemic heart disease and other diseases of the circulatory system: Secondary | ICD-10-CM | POA: Diagnosis not present

## 2018-06-23 DIAGNOSIS — R9431 Abnormal electrocardiogram [ECG] [EKG]: Secondary | ICD-10-CM | POA: Diagnosis not present

## 2018-06-23 DIAGNOSIS — F329 Major depressive disorder, single episode, unspecified: Secondary | ICD-10-CM | POA: Insufficient documentation

## 2018-06-23 MED ORDER — FLECAINIDE ACETATE 50 MG PO TABS: 50.0000 mg | ORAL_TABLET | Freq: Two times a day (BID) | ORAL | 3 refills | Status: DC

## 2018-06-23 NOTE — Progress Notes (Addendum)
Patient ID: Elizabeth SarkNancy K Faucett, female   DOB: 19-Aug-1940, 78 y.o.   MRN: [id number]      Primary Care Physician: Maximiano CossHungarland, John David, MD Referring Physician:  ER f/u   Elizabeth Sarkancy K Sherman is a 78 y.o. female with a h/o new onset afib 01/14/16. This was dx at her PCP, picked up at the exam, pt was unaware. She then decided she would go to the ER for her heart to be evaluated. She was asymptomatic. She was given one eliquis for that evening, rx for cardizem, and was referred here.  She was seen in the afib clinic for the first time, 01/20/16, by then on cardizem and was in afib at 53 bpm,  stated that she feels well and denies any shortness of breath, or fatigue. 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 She does not thinks she snores, no smoking, no alcohol, no large amounts of caffeine.Chadsvasc score of at least 4.  Pt here for f/u, 8/14/`7 and is feeling well. She had one episode of chest pain for which she was [facility], 01/23/16, and it had resolved by the time she reached the ER and has not returned. She is still unaware of afib but would like to attempt cardioversion when she has bee on blood thinner x 3 weeks. No issues with the anticoagulant and is taking regularly.  Returns to the afib clinic 02/27/16, f/u cardioversion. This was successful and she remains in SR. She thinks she may see slight improvement in SR but no significant change in her overall energy level.Remains on apixaban.  F/u in a fib clinc 05/28/16. She has been staying in SR. She feels well. Continues on eliquis, no bleeding issues.  F/u in afib clinic 02/22/17 and she is in afib at 92 bpm. She is unaware. She is under extra stress from her daughter being diagnosed with cancer and is coming with her to the Ca center at [facility] 5 days a [place], [place]. She continues on eliquis.  F/u in the afib clinic, 03/13/17. She continues with afib, rate controlled. She is minimally  symptomatic with this but would like to undergo another cardioversion, however, when to schedule it is up in the air, as her schedule is so full transporting her daughter to radiation. No missed dosed of eliquis.  F/u in afib clinic, 04/18/17, pt had been set up for cardioversion several weeks ago but called and cancelled as she developed an URI. She is now getting over it and is back in the afib clinic to further discuss. I am concerned that now she has been in afib for 2 months and her atrium are severly dilated. I am surprised that she stayed in SR as well as she did since last cardioversion about 2 years ago.  F/u in afib clinic 05/02/17, she has been started on flecainide  50 mg bid, after a low risk stress test. EKG shows no interval change but  remains in afib, rate controlled. She is over her URI and is feeling better from that standpoint. No missed doses of eliquis.  F/u in afib clinic, after successful cardioversion 9/01,but she feels dizzy despite being in SR.Marland [place]. She called office last week with these c/o and flecainide was reduced to 50 mg bid. Ekg shows SR at 85 bpm, intervals OK. Will try to reduce to 25 mg bid and see if symptoms improve. She refuses a flecainide level today.  F/u  in fib clinic, 08/12/17. She is staying in SR. She feels well. She called in after cardioversion c/o dizziness and flecainide was reduced to 25 mg bid. This has kept her in SR and eliminated her c/o dizziness.  F/u in afib clinic 06/23/2018, as she felt her mood is down, wanted something for nerves and she had noted an increase in her HR. She is in afib today,  unaware and does not know when she returned to afib. States that she has not missed any doses of eliquis.  Today, she denies symptoms of palpitations, chest pain, shortness of breath, orthopnea, PND, lower extremity edema, dizziness, presyncope, syncope, or neurologic sequela. The patient is tolerating medications without difficulties and is otherwise without  complaint today.   Past Medical History:  Diagnosis Date  . A-fib (HCC)   . Depression   . Hyperlipidemia   . Hypertension    Past Surgical History:  Procedure Laterality Date  . ABDOMINAL HYSTERECTOMY    . APPENDECTOMY    . CARDIOVERSION N/A 02/17/2016   Procedure: CARDIOVERSION;  Surgeon: Chrystie Nose, MD;  Location[facility] ENDOSCOPY;  Service: Cardiovascular;  Laterality: N/A;  . CARDIOVERSION N/A 05/14/2017   Procedure: CARDIOVERSION;  Surgeon: Laurey Morale, MD;  Location: [facility] ENDOSCOPY;  Service: Cardiovascular;  Laterality: N/A;    Current Outpatient Medications  Medication Sig Dispense Refill  . apixaban (ELIQUIS) 5 MG TABS tablet Take 1 tablet (5 mg total) by mouth 2 (two) times daily. 60 tablet 6  . busPIRone (BUSPAR) 5 MG tablet Take 1 tablet (5 mg total) by mouth 3 (three) times daily as needed. (Patient taking differently: Take 5 mg by mouth as needed. ) 15 tablet 1  . Cholecalciferol (VITAMIN D3) 2000 units TABS Take 2,000 Units 2 (two) times a week by mouth.    . diltiazem (CARTIA XT) 120 MG 24 hr capsule Take 1 capsule (120 mg total) by mouth daily. 90 capsule 2  . flecainide (TAMBOCOR) 50 MG tablet Take 0.5 tablets (25 mg total) by mouth 2 (two) times daily. 30 tablet 3  . losartan (COZAAR) 50 MG tablet TAKE 1 TABLET BY MOUTH ONCE DAILY 90 tablet 2  . omeprazole (PRILOSEC OTC) 20 MG tablet Take 1 tablet by mouth.     No current facility-administered medications for this encounter.     No Known Allergies  Social History   Socioeconomic History  . Marital status: Single    Spouse name: Not on file  . Number of children: Not on file  . Years of education: Not on file  . Highest education level: Not on file  Occupational History  . Not on file  Social Needs  . Financial resource strain: Not on file  . Food insecurity:    Worry: Not on file    Inability: Not on file  . Transportation needs:    Medical: Not on file    Non-medical: Not on file  Tobacco Use   . Smoking status: Never Smoker  . Smokeless tobacco: Never Used  Substance and Sexual Activity  . Alcohol use: No  . Drug use: No  . Sexual activity: Not on file  Lifestyle  . Physical activity:    Days per week: Not on file    Minutes per session: Not on file  . Stress: Not on file  Relationships  . Social connections:    Talks on phone: Not on file    Gets together: Not on file    Attends religious service: Not  on file    Active member of club or organization: Not on file    Attends meetings of clubs or organizations: Not on file    Relationship status: Not on file  . Intimate partner violence:    Fear of current or ex partner: Not on file    Emotionally abused: Not on file    Physically abused: Not on file    Forced sexual activity: Not on file  Other Topics Concern  . Not on file  Social History Narrative  . Not on file    Family History  Problem Relation Age of Onset  . Heart failure Mother     ROS- All systems are reviewed and negative except as per the HPI above  Physical Exam: Vitals:   06/23/18 1605  BP: 114/84  Pulse: 98  SpO2: 98%  Weight: 91 kg  Height: 5\' 6"  (1.676 m)    GEN- The patient is well appearing, alert and oriented x 3 today.   Head- normocephalic, atraumatic Eyes-  Sclera clear, conjunctiva pink Ears- hearing intact Oropharynx- clear Neck- supple, no JVP Lymph- no cervical lymphadenopathy Lungs- Clear to ausculation bilaterally, normal work of breathing Heart- regular rate and rhythm, no murmurs, rubs or gallops, PMI not laterally displaced GI- soft, NT, ND, + BS Extremities- no clubbing, cyanosis, or edema MS- no significant deformity or atrophy Skin- no rash or lesion Psych- euthymic mood, full affect Neuro- strength and sensation are intact  EKG-afib at 98 bpm, qrs int 88 ms, qtc 439 ms ms Epic records reviewed  Echo--8/17- Left ventricle: The cavity size was normal. Wall thickness was   increased in a pattern of mild  LVH. Systolic function was normal.   The estimated ejection fraction was in the range of 60% to 65%.   Indeterminant diastolic function (atrial fibrillation). Wall   motion was normal; there were no regional wall motion   abnormalities. - Aortic valve: There was no stenosis. - Mitral valve: There was trivial regurgitation. - Left atrium: The atrium was severely dilated, 41 mm, diameter, LA volume 103 ml - Right ventricle: The cavity size was normal. Systolic function   was normal. - Right atrium: The atrium was severely dilated. - Pulmonary arteries: No complete TR doppler jet so unable to   estimate PA systolic pressure. - Inferior vena cava: The vessel was normal in size. The   respirophasic diameter changes were in the normal range (>= 50%),   consistent with normal central venous pressure. - Pericardium, extracardiac: Prominent epicardial adipose tissue.  Impressions:  - The patient was in atrial fibrillation. Normal LV size with mild   LV hypertrophy. EF 60-65%. Normal RV size and systolic function.   No significant valvular abnormalities. Severe biatrial   enlargement.   Stress test-Nuclear stress EF: 74%.  There was no ST segment deviation noted during stress.  The study is normal.  The left ventricular ejection fraction is hyperdynamic (>65%).   1. EF 74%, normal wall motion.  2. No evidence for ischemia or infarction on perfusion images.   Normal study.   Assessment and Plan:  1. Persistent  afib Pt is unaware of when she returned to afib  Will increase flecainide to 50 mg bid and plan on cardioversion within the week  She had some dizziness at 50 mg bid in the past and with return of SR dose lowered to 25 mg bid Continue cardizem at 120 mg daily Continue eliquis 5 mg bid, no missed doses for at least  3 weeks, chadsvasc score is at least 4 Cbc/bmet to done with PCP  2. Depressed mood Referred to  PCP to discuss  Would avoid Wellbutrin as it interferes  with clearance  of flecainide   F/u with me in one week after cardioversion   Addendum- labs received form PCP and show cbc with HGB at 11.7, HCT at 35.7plts, 393, creatinine at 1.48 ms, BUN at 34, K+ at 4.5, creatinine, bun elevated, will call pt and make sure she is drinking enough water, ? Recent diarrhea   Elvina SidleDonna C. Matthew Folksarroll, ANP-C Afib Clinic [facility] [address] [place], [place] [address] [phone]

## 2018-06-23 NOTE — Patient Instructions (Signed)
Cardioversion scheduled for Monday, January 13th   - Arrive at the [facility] and go to admitting at [facility]  -Do not eat or drink anything after midnight the night prior to your procedure.  - Take all your morning medication with a sip of water prior to arrival.  - You will not be able to drive home after your procedure.

## 2018-06-24 ENCOUNTER — Other Ambulatory Visit (HOSPITAL_COMMUNITY): Payer: Self-pay | Admitting: *Deleted

## 2018-06-24 ENCOUNTER — Encounter: Payer: Self-pay | Admitting: Cardiovascular Disease

## 2018-06-24 DIAGNOSIS — I4819 Other persistent atrial fibrillation: Secondary | ICD-10-CM

## 2018-06-24 MED ORDER — FLECAINIDE ACETATE 50 MG PO TABS: 50.0000 mg | ORAL_TABLET | Freq: Two times a day (BID) | ORAL | 3 refills | Status: DC

## 2018-06-25 NOTE — Addendum Note (Signed)
Encounter addended by: Newman Nip, NP on: 06/25/2018 4:57 PM  Actions taken: Clinical Note Signed

## 2018-06-30 ENCOUNTER — Encounter (HOSPITAL_COMMUNITY)
Admission: RE | Disposition: A | Discharge status: Home / Self Care | Payer: Self-pay | Source: Home / Self Care | Attending: Cardiovascular Disease

## 2018-06-30 ENCOUNTER — Encounter (HOSPITAL_COMMUNITY): Payer: Self-pay | Admitting: *Deleted

## 2018-06-30 ENCOUNTER — Ambulatory Visit (HOSPITAL_COMMUNITY)
Admission: RE | Admit: 2018-06-30 | Discharge: 2018-06-30 | Disposition: A | Discharge status: Home / Self Care | Payer: Medicare Other | Attending: Cardiovascular Disease | Admitting: Cardiovascular Disease

## 2018-06-30 DIAGNOSIS — I440 Atrioventricular block, first degree: Secondary | ICD-10-CM | POA: Insufficient documentation

## 2018-06-30 DIAGNOSIS — Z8679 Personal history of other diseases of the circulatory system: Secondary | ICD-10-CM | POA: Insufficient documentation

## 2018-06-30 SURGERY — CANCELLED PROCEDURE

## 2018-06-30 NOTE — Progress Notes (Signed)
Patient arrived to endoscopy for outpatient cardioversion. Patient was normal sinus rhythm on the monitor and 12 lead EKG obtained and showed sinus rhythm. Verbal order from doctor Duke Salvia to cancel the cardioversion.

## 2018-07-03 IMAGING — NM NM MYOCAR MULTI W/ SPECT
3 series · 18 of 18 positions shown · non-contrast
Comparison: none

[Series 1: stress_(id)_sa · 6.5mm · 6.51mm/px · 6 of 512 frames shown (1 of 2)]
[frame 43/512]
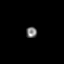
[frame 128/512]
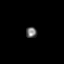
[frame 214/512]
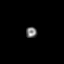
[frame 299/512]
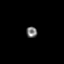
[frame 384/512]
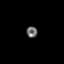
[frame 470/512]
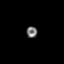

[Series 1: rest_(id)_sa · 6.5mm · 6.51mm/px · 6 of 64 frames shown]
[frame 6/64]
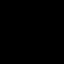
[frame 16/64]
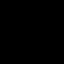
[frame 27/64]
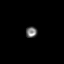
[frame 38/64]
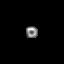
[frame 48/64]
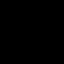
[frame 59/64]
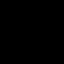

[Series 1: stress_(id)_sa · 6.5mm · 6.51mm/px · 6 of 64 frames shown (2 of 2)]
[frame 6/64]
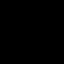
[frame 16/64]
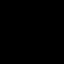
[frame 27/64]
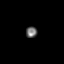
[frame 38/64]
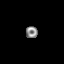
[frame 48/64]
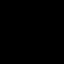
[frame 59/64]
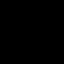

[18 of 18 positions shown; findings below may reference images not displayed]

Canned report from images found in remote index.

Refer to host system for actual result text.

## 2018-07-07 ENCOUNTER — Ambulatory Visit (HOSPITAL_COMMUNITY): Payer: Medicare Other | Admitting: Nurse Practitioner

## 2018-07-08 ENCOUNTER — Ambulatory Visit (HOSPITAL_COMMUNITY): Payer: Medicare Other | Admitting: Nurse Practitioner

## 2018-11-04 ENCOUNTER — Other Ambulatory Visit (HOSPITAL_COMMUNITY): Payer: Self-pay | Admitting: Nurse Practitioner

## 2018-11-05 ENCOUNTER — Other Ambulatory Visit (HOSPITAL_COMMUNITY): Payer: Self-pay | Admitting: Nurse Practitioner

## 2018-12-05 ENCOUNTER — Other Ambulatory Visit (HOSPITAL_COMMUNITY): Payer: Self-pay | Admitting: Nurse Practitioner

## 2018-12-09 ENCOUNTER — Ambulatory Visit (HOSPITAL_COMMUNITY)
Admission: RE | Admit: 2018-12-09 | Discharge: 2018-12-09 | Disposition: A | Discharge status: Home / Self Care | Payer: Medicare Other | Source: Ambulatory Visit | Attending: Nurse Practitioner | Admitting: Nurse Practitioner

## 2018-12-09 ENCOUNTER — Encounter (HOSPITAL_COMMUNITY): Payer: Self-pay | Admitting: Nurse Practitioner

## 2018-12-09 ENCOUNTER — Other Ambulatory Visit: Payer: Self-pay

## 2018-12-09 VITALS — BP 146/66 | HR 50 | Ht 66.0 in | Wt 204.0 lb

## 2018-12-09 DIAGNOSIS — Z79899 Other long term (current) drug therapy: Secondary | ICD-10-CM | POA: Insufficient documentation

## 2018-12-09 DIAGNOSIS — Z7901 Long term (current) use of anticoagulants: Secondary | ICD-10-CM | POA: Insufficient documentation

## 2018-12-09 DIAGNOSIS — I4891 Unspecified atrial fibrillation: Secondary | ICD-10-CM

## 2018-12-09 DIAGNOSIS — F419 Anxiety disorder, unspecified: Secondary | ICD-10-CM | POA: Insufficient documentation

## 2018-12-09 DIAGNOSIS — I4819 Other persistent atrial fibrillation: Secondary | ICD-10-CM | POA: Diagnosis not present

## 2018-12-09 DIAGNOSIS — Z8249 Family history of ischemic heart disease and other diseases of the circulatory system: Secondary | ICD-10-CM | POA: Insufficient documentation

## 2018-12-09 DIAGNOSIS — F329 Major depressive disorder, single episode, unspecified: Secondary | ICD-10-CM | POA: Insufficient documentation

## 2018-12-09 DIAGNOSIS — I10 Essential (primary) hypertension: Secondary | ICD-10-CM | POA: Diagnosis not present

## 2018-12-09 NOTE — Progress Notes (Signed)
Patient ID: [name], female   DOB: 05/18/1941, 78 y.o.   MRN: [id number]      Primary Care Physician: Yvone Neu, MD Referring Physician:  ER f/u   Elizabeth Sherman is a 78 y.o. female with a h/o new onset afib 01/14/16. This was dx at her PCP, picked up at the exam, pt was unaware. She then decided she would go to the ER for her heart to be evaluated. She was asymptomatic. She was given one eliquis for that evening, rx for cardizem, and was referred here.  She was seen in the afib clinic for the first time, 01/20/16, by then on cardizem and was in afib at 53 bpm,  stated that she feels well and denies any shortness of breath, or fatigue. 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 She does not thinks she snores, no smoking, no alcohol, no large amounts of caffeine.Chadsvasc score of at least 4.  Pt here for f/u, 8/14/`7 and is feeling well. She had one episode of chest pain for which she was evaluated for at [facility] hospital in the ER, 01/23/16, and it had resolved by the time she reached the ER and has not returned. She is still unaware of afib but would like to attempt cardioversion when she has bee on blood thinner x 3 weeks. No issues with the anticoagulant and is taking regularly.  Returns to the afib clinic 02/27/16, f/u cardioversion. This was successful and she remains in SR. She thinks she may see slight improvement in SR but no significant change in her overall energy level.Remains on apixaban.  F/u in a fib clinc 05/28/16. She has been staying in [place]. She feels well. Continues on eliquis, no bleeding issues.  F/u in afib clinic 02/22/17 and she is in afib at 92 bpm. She is unaware. She is under extra stress from her daughter being diagnosed with cancer and is coming with her to the Ca center at [place] 5 days a week, from [place].Marland Kitchen She continues on eliquis.  F/u in the afib clinic, 03/13/17. She continues with afib, rate controlled. She is minimally  symptomatic with this but would like to undergo another cardioversion, however, when to schedule it is up in the air, as her schedule is so full transporting her daughter to radiation. No missed dosed of eliquis.  F/u in afib clinic, 04/18/17, pt had been set up for cardioversion several weeks ago but called and cancelled as she developed an URI. She is now getting over it and is back in the afib clinic to further discuss. I am concerned that now she has been in afib for 2 months and her atrium are severly dilated. I am surprised that she stayed in SR as well as she did since last cardioversion about 2 years ago.  F/u in afib clinic 05/02/17, she has been started on flecainide  50 mg bid, after a low risk stress test. EKG shows no interval change but  remains in afib, rate controlled. She is over her URI and is feeling better from that standpoint. No missed doses of eliquis.  F/u in afib clinic, after successful cardioversion 9/01,but she feels dizzy despite being in SR.Marland Kitchen She called office last week with these c/o and flecainide was reduced to 50 mg bid. Ekg shows SR at 85 bpm, intervals OK. Will try to reduce to 25 mg bid and see if symptoms improve. She refuses a flecainide level today.  F/u  in fib clinic, 08/12/17. She is staying in [place]. She feels well. She called in after cardioversion c/o dizziness and flecainide was reduced to 25 mg bid. This has kept her in SR and eliminated her c/o dizziness.  F/u in afib clinic 02/03/18. She is doing well staying in SR on flecainide. Voices no complaints today.  F/u in afib clinic, 12/09/18 and is in [place]. She has not noted any afib and is staying in rhythm on flecainide 50 mg bid. No issues with DOAC. She was having some dizziness and her PCP reduced her losartan to 25 mg daily and this resolved the dizziness which reasonable BP control.  Today, she denies symptoms of palpitations, chest pain, shortness of breath, orthopnea, PND, lower extremity edema, dizziness,  presyncope, syncope, or neurologic sequela. The patient is tolerating medications without difficulties and is otherwise without complaint today.   Past Medical History:  Diagnosis Date  . A-fib ([place])   . Depression   . Hyperlipidemia   . Hypertension    Past Surgical History:  Procedure Laterality Date  . ABDOMINAL HYSTERECTOMY    . APPENDECTOMY    . CARDIOVERSION N/A 02/17/2016   Procedure: CARDIOVERSION;  Surgeon: Pixie Casino, MD;  Location: [facility] ENDOSCOPY;  Service: Cardiovascular;  Laterality: N/A;  . CARDIOVERSION N/A 05/14/2017   Procedure: CARDIOVERSION;  Surgeon: Larey Dresser, MD;  Location: [facility] ENDOSCOPY;  Service: Cardiovascular;  Laterality: N/A;    Current Outpatient Medications  Medication Sig Dispense Refill  . apixaban (ELIQUIS) 5 MG TABS tablet Take 1 tablet (5 mg total) by mouth 2 (two) times daily. 60 tablet 6  . busPIRone (BUSPAR) 5 MG tablet Take 1 tablet (5 mg total) by mouth 3 (three) times daily as needed. (Patient taking differently: Take 5 mg by mouth 3 (three) times daily as needed (anxiety attacks). ) 15 tablet 1  . Cholecalciferol (VITAMIN D3) 2000 units TABS Take 2,000 Units 2 (two) times a week by mouth.    . diltiazem (CARTIA XT) 120 MG 24 hr capsule Take 1 capsule (120 mg total) by mouth daily. appt required for refills [phone] 90 capsule 0  . flecainide (TAMBOCOR) 50 MG tablet Take 1 tablet (50 mg total) by mouth 2 (two) times daily. 60 tablet 2  . losartan (COZAAR) 50 MG tablet TAKE 1 TABLET BY MOUTH ONCE DAILY (Patient taking differently: Take 25 mg by mouth daily. ) 90 tablet 2  . omeprazole (PRILOSEC OTC) 20 MG tablet Take 20 mg by mouth daily before breakfast.      No current facility-administered medications for this encounter.     No Known Allergies  Social History   Socioeconomic History  . Marital status: Single    Spouse name: Not on file  . Number of children: Not on file  . Years of education: Not on file  . Highest  education level: Not on file  Occupational History  . Not on file  Social Needs  . Financial resource strain: Not on file  . Food insecurity    Worry: Not on file    Inability: Not on file  . Transportation needs    Medical: Not on file    Non-medical: Not on file  Tobacco Use  . Smoking status: Never Smoker  . Smokeless tobacco: Never Used  Substance and Sexual Activity  . Alcohol use: No  . Drug use: No  . Sexual activity: Not on file  Lifestyle  . Physical activity    Days per week: Not on  file    Minutes per session: Not on file  . Stress: Not on file  Relationships  . Social Musicianconnections    Talks on phone: Not on file    Gets together: Not on file    Attends religious service: Not on file    Active member of club or organization: Not on file    Attends meetings of clubs or organizations: Not on file    Relationship status: Not on file  . Intimate partner violence    Fear of current or ex partner: Not on file    Emotionally abused: Not on file    Physically abused: Not on file    Forced sexual activity: Not on file  Other Topics Concern  . Not on file  Social History Narrative  . Not on file    Family History  Problem Relation Age of Onset  . Heart failure Mother     ROS- All systems are reviewed and negative except as per the HPI above  Physical Exam: Vitals:   12/09/18 1054  BP: (!) 146/66  Pulse: (!) 50  Weight: 92.5 kg  Height: 5\' 6"  (1.676 m)    GEN- The patient is well appearing, alert and oriented x 3 today.   Head- normocephalic, atraumatic Eyes-  Sclera clear, conjunctiva pink Ears- hearing intact Oropharynx- clear Neck- supple, no JVP Lymph- no cervical lymphadenopathy Lungs- Clear to ausculation bilaterally, normal work of breathing Heart- regular rate and rhythm, no murmurs, rubs or gallops, PMI not laterally displaced GI- soft, NT, ND, + BS Extremities- no clubbing, cyanosis, or edema MS- no significant deformity or atrophy Skin-  no rash or lesion Psych- euthymic mood, full affect Neuro- strength and sensation are intact  EKG-NSR at 70 bpm, PR int 204 ms, qrs int 90 ms, qtc 442 ms  Echo--8/17- Left ventricle: The cavity size was normal. Wall thickness was   increased in a pattern of mild LVH. Systolic function was normal.   The estimated ejection fraction was in the range of 60% to 65%.   Indeterminant diastolic function (atrial fibrillation). Wall   motion was normal; there were no regional wall motion   abnormalities. - Aortic valve: There was no stenosis. - Mitral valve: There was trivial regurgitation. - Left atrium: The atrium was severely dilated, 41 mm, diameter, LA volume 103 ml - Right ventricle: The cavity size was normal. Systolic function   was normal. - Right atrium: The atrium was severely dilated. - Pulmonary arteries: No complete TR doppler jet so unable to   estimate PA systolic pressure. - Inferior vena cava: The vessel was normal in size. The   respirophasic diameter changes were in the normal range (>= 50%),   consistent with normal central venous pressure. - Pericardium, extracardiac: Prominent epicardial adipose tissue.  Impressions:  - The patient was in atrial fibrillation. Normal LV size with mild   LV hypertrophy. EF 60-65%. Normal RV size and systolic function.   No significant valvular abnormalities. Severe biatrial   enlargement.   Stress test-Nuclear stress EF: 74%.  There was no ST segment deviation noted during stress.  The study is normal.  The left ventricular ejection fraction is hyperdynamic (>65%).   1. EF 74%, normal wall motion.  2. No evidence for ischemia or infarction on perfusion images.   Normal study.   Assessment and Plan:  1. Persistent  afib Had been maintaining SR on flecainide 50 mg bid Continue cardizem 120 mg qd Continue eliquis 5 mg bid,  chadsvasc score is at least 4  2. HTN Stable  Continue losartan 25 mg daily  F/u in 6 months    Lupita LeashDonna C. Matthew Folksarroll, ANP-C [facility] [address] [place], [place] [address] [phone]

## 2019-01-03 ENCOUNTER — Other Ambulatory Visit (HOSPITAL_COMMUNITY): Payer: Self-pay | Admitting: Nurse Practitioner

## 2019-02-04 ENCOUNTER — Other Ambulatory Visit (HOSPITAL_COMMUNITY): Payer: Self-pay | Admitting: Nurse Practitioner

## 2019-02-12 ENCOUNTER — Other Ambulatory Visit (HOSPITAL_COMMUNITY): Payer: Self-pay | Admitting: Nurse Practitioner

## 2019-03-05 ENCOUNTER — Other Ambulatory Visit (HOSPITAL_COMMUNITY): Payer: Self-pay | Admitting: Nurse Practitioner

## 2019-03-16 ENCOUNTER — Ambulatory Visit (INDEPENDENT_AMBULATORY_CARE_PROVIDER_SITE_OTHER): Payer: Medicare Other | Admitting: Orthopedic Surgery

## 2019-03-16 ENCOUNTER — Encounter: Payer: Self-pay | Admitting: Orthopedic Surgery

## 2019-03-16 ENCOUNTER — Other Ambulatory Visit: Payer: Self-pay

## 2019-03-16 ENCOUNTER — Telehealth: Payer: Self-pay | Admitting: Radiology

## 2019-03-16 VITALS — BP 143/66 | HR 63 | Ht 66.0 in | Wt 200.0 lb

## 2019-03-16 DIAGNOSIS — I739 Peripheral vascular disease, unspecified: Secondary | ICD-10-CM | POA: Diagnosis not present

## 2019-03-16 NOTE — Progress Notes (Signed)
CALEA HRIBAR  03/16/2019  HISTORY SECTION :  Chief Complaint  Patient presents with  . Leg Pain    both legs hurt from knees down   HPI The patient presents for evaluation of bilateral leg pain.  78 year old female non-smoker history of atrial fibrillation on Eliquis presents for evaluation of bilateral leg pain that she has had for several months she specifically says it hurts from the proximal leg down to the ankles and only when she is walking is relieved with rest  Review of Systems  All other systems reviewed and are negative.    has a past medical history of A-fib ([place]), Depression, Hyperlipidemia, and Hypertension.   Past Surgical History:  Procedure Laterality Date  . ABDOMINAL HYSTERECTOMY    . APPENDECTOMY    . CARDIOVERSION N/A 02/17/2016   Procedure: CARDIOVERSION;  Surgeon: Pixie Casino, MD;  Location: [facility] ENDOSCOPY;  Service: Cardiovascular;  Laterality: N/A;  . CARDIOVERSION N/A 05/14/2017   Procedure: CARDIOVERSION;  Surgeon: Larey Dresser, MD;  Location: [facility] ENDOSCOPY;  Service: Cardiovascular;  Laterality: N/A;    Body mass index is 32.28 kg/m.   No Known Allergies   Current Outpatient Medications:  .  CARTIA XT 120 MG 24 hr capsule, TAKE 1 CAPSULE BY MOUTH DAILY - NEED TO MAKE APPOINTMENT, Disp: 90 capsule, Rfl: 6 .  Cholecalciferol (VITAMIN D3) 2000 units TABS, Take 2,000 Units 2 (two) times a week by mouth., Disp: , Rfl:  .  ELIQUIS 5 MG TABS tablet, Take 1 tablet by mouth twice daily, Disp: 60 tablet, Rfl: 6 .  flecainide (TAMBOCOR) 50 MG tablet, Take 1 tablet by mouth twice daily, Disp: 60 tablet, Rfl: 3 .  losartan (COZAAR) 50 MG tablet, Take 0.5 tablets (25 mg total) by mouth daily., Disp: 45 tablet, Rfl: 1 .  omeprazole (PRILOSEC OTC) 20 MG tablet, Take 20 mg by mouth daily before breakfast. , Disp: , Rfl:  .  busPIRone (BUSPAR) 5 MG tablet, Take 1 tablet (5 mg total) by mouth 3 (three) times daily as needed. (Patient not taking: Reported on  03/16/2019), Disp: 15 tablet, Rfl: 1   PHYSICAL EXAM SECTION: 1) BP (!) 143/66   Pulse 63   Ht 5\' 6"  (1.676 m)   Wt 200 lb (90.7 kg)   BMI 32.28 kg/m   Body mass index is 32.28 kg/m. General appearance: Well-developed well-nourished no gross deformities  2) Cardiovascular normal pulse and perfusion in the  extremities increased redness bilateral pitting edema from the ankle to the knee with bilateral tenderness 3) Neurologically deep tendon reflexes are equal and normal, no sensation loss or deficits no pathologic reflexes  4) Psychological: Awake alert and oriented x3 mood and affect normal  5) Skin no lacerations or ulcerations no nodularity no palpable masses, no erythema or nodularity  6) Musculoskeletal:  No knee pain swelling or tenderness in either leg full range of motion normal strength and stability bilaterally   MEDICAL DECISION SECTION:  Encounter Diagnosis  Name Primary?  . Claudication [facility]) Yes    Imaging None  Plan:  (Rx., Inj., surg., Frx, MRI/CT, XR:2)  Referral to vascular vein specialist  11:24 AM Arther Abbott, MD  03/16/2019

## 2019-03-16 NOTE — Telephone Encounter (Signed)
I called patient about the referral to Vascular surgeon, I told her we would send to [facility] but there is an office in [place] and they should call her with appointment

## 2019-03-16 NOTE — Patient Instructions (Signed)
Appointment will be arranged with vascular vein specialist for circulation problems  Patient is having a circulatory issue does not need knee x-ray

## 2019-03-27 ENCOUNTER — Ambulatory Visit (HOSPITAL_COMMUNITY)
Admission: RE | Admit: 2019-03-27 | Discharge: 2019-03-27 | Disposition: A | Discharge status: Home / Self Care | Payer: Medicare Other | Source: Ambulatory Visit | Attending: Nurse Practitioner | Admitting: Nurse Practitioner

## 2019-03-27 ENCOUNTER — Other Ambulatory Visit: Payer: Self-pay

## 2019-03-27 ENCOUNTER — Encounter (HOSPITAL_COMMUNITY): Payer: Self-pay | Admitting: Nurse Practitioner

## 2019-03-27 VITALS — BP 152/74 | HR 98 | Ht 66.0 in | Wt 200.0 lb

## 2019-03-27 DIAGNOSIS — E785 Hyperlipidemia, unspecified: Secondary | ICD-10-CM | POA: Diagnosis not present

## 2019-03-27 DIAGNOSIS — I480 Paroxysmal atrial fibrillation: Secondary | ICD-10-CM | POA: Diagnosis not present

## 2019-03-27 DIAGNOSIS — I10 Essential (primary) hypertension: Secondary | ICD-10-CM | POA: Insufficient documentation

## 2019-03-27 DIAGNOSIS — Z79899 Other long term (current) drug therapy: Secondary | ICD-10-CM | POA: Diagnosis not present

## 2019-03-27 DIAGNOSIS — I4819 Other persistent atrial fibrillation: Secondary | ICD-10-CM | POA: Insufficient documentation

## 2019-03-27 DIAGNOSIS — Z7901 Long term (current) use of anticoagulants: Secondary | ICD-10-CM | POA: Insufficient documentation

## 2019-03-27 NOTE — Progress Notes (Signed)
Patient ID: Elizabeth Sherman, female   DOB: 05/18/1941, 78 y.o.   MRN: [id number]      Primary Care Physician: Yvone Neu, MD Referring Physician:  ER f/u   Elizabeth Sherman is a 78 y.o. female with a h/o new onset afib 01/14/16. This was dx at her PCP, picked up at the exam, pt was unaware. She then decided she would go to the ER for her heart to be evaluated. She was asymptomatic. She was given one eliquis for that evening, rx for cardizem, and was referred here.  She was seen in the afib clinic for the first time, 01/20/16, by then on cardizem and was in afib at 53 bpm,  stated that she feels well and denies any shortness of breath, or fatigue. 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 She does not thinks she snores, no smoking, no alcohol, no large amounts of caffeine.Chadsvasc score of at least 4.  Pt here for f/u, 8/14/`7 and is feeling well. She had one episode of chest pain for which she was evaluated for at [facility] hospital in the ER, 01/23/16, and it had resolved by the time she reached the ER and has not returned. She is still unaware of afib but would like to attempt cardioversion when she has bee on blood thinner x 3 weeks. No issues with the anticoagulant and is taking regularly.  Returns to the afib clinic 02/27/16, f/u cardioversion. This was successful and she remains in SR. She thinks she may see slight improvement in SR but no significant change in her overall energy level.Remains on apixaban.  F/u in a fib clinc 05/28/16. She has been staying in [place]. She feels well. Continues on eliquis, no bleeding issues.  F/u in afib clinic 02/22/17 and she is in afib at 92 bpm. She is unaware. She is under extra stress from her daughter being diagnosed with cancer and is coming with her to the Ca center at [place] 5 days a week, from [place].Marland Kitchen She continues on eliquis.  F/u in the afib clinic, 03/13/17. She continues with afib, rate controlled. She is minimally  symptomatic with this but would like to undergo another cardioversion, however, when to schedule it is up in the air, as her schedule is so full transporting her daughter to radiation. No missed dosed of eliquis.  F/u in afib clinic, 04/18/17, pt had been set up for cardioversion several weeks ago but called and cancelled as she developed an URI. She is now getting over it and is back in the afib clinic to further discuss. I am concerned that now she has been in afib for 2 months and her atrium are severly dilated. I am surprised that she stayed in SR as well as she did since last cardioversion about 2 years ago.  F/u in afib clinic 05/02/17, she has been started on flecainide  50 mg bid, after a low risk stress test. EKG shows no interval change but  remains in afib, rate controlled. She is over her URI and is feeling better from that standpoint. No missed doses of eliquis.  F/u in afib clinic, after successful cardioversion 9/01,but she feels dizzy despite being in SR.Marland Kitchen She called office last week with these c/o and flecainide was reduced to 50 mg bid. Ekg shows SR at 85 bpm, intervals OK. Will try to reduce to 25 mg bid and see if symptoms improve. She refuses a flecainide level today.  F/u  in fib clinic, 08/12/17. She is staying in [place]. She feels well. She called in after cardioversion c/o dizziness and flecainide was reduced to 25 mg bid. This has kept her in SR and eliminated her c/o dizziness.  F/u in afib clinic 02/03/18. She is doing well staying in SR on flecainide. Voices no complaints today.  F/u in afib clinic, 12/09/18 and is in [place]. She has not noted any afib and is staying in rhythm on flecainide 50 mg bid. No issues with DOAC. She was having some dizziness and her PCP reduced her losartan to 25 mg daily and this resolved the dizziness which reasonable BP control.  F/u in afib clinic, 03/27/19. She feels well. No awareness of afib. Continues on flecainide. No bleeding issues with eliquis.   Today, she denies symptoms of palpitations, chest pain, shortness of breath, orthopnea, PND, lower extremity edema, dizziness, presyncope, syncope, or neurologic sequela. The patient is tolerating medications without difficulties and is otherwise without complaint today.   Past Medical History:  Diagnosis Date  . A-fib ([facility])   . Depression   . Hyperlipidemia   . Hypertension    Past Surgical History:  Procedure Laterality Date  . ABDOMINAL HYSTERECTOMY    . APPENDECTOMY    . CARDIOVERSION N/A 02/17/2016   Procedure: CARDIOVERSION;  Surgeon: Chrystie NoseKenneth C Hilty, MD;  [facility];  Service: Cardiovascular;  Laterality: N/A;  . CARDIOVERSION N/A 05/14/2017   Procedure: CARDIOVERSION;  Surgeon: Laurey MoraleMcLean, Dalton S, MD;  [facility];  Service: Cardiovascular;  Laterality: N/A;    Current Outpatient Medications  Medication Sig Dispense Refill  . busPIRone (BUSPAR) 5 MG tablet Take 1 tablet (5 mg total) by mouth 3 (three) times daily as needed. 15 tablet 1  . CARTIA XT 120 MG 24 hr capsule TAKE 1 CAPSULE BY MOUTH DAILY - NEED TO MAKE APPOINTMENT 90 capsule 6  . Cholecalciferol (VITAMIN D3) 2000 units TABS Take 2,000 Units 2 (two) times a week by mouth.    Everlene Balls. ELIQUIS 5 MG TABS tablet Take 1 tablet by mouth twice daily 60 tablet 6  . flecainide (TAMBOCOR) 50 MG tablet Take 1 tablet by mouth twice daily 60 tablet 3  . losartan (COZAAR) 50 MG tablet Take 0.5 tablets (25 mg total) by mouth daily. 45 tablet 1  . omeprazole (PRILOSEC OTC) 20 MG tablet Take 20 mg by mouth daily before breakfast.     . PROAIR HFA 108 (90 Base) MCG/ACT inhaler INHALE 2 PUFFS BY MOUTH EVERY 4 HOURS AS NEEDED FOR WHEEZING SHORTNESS OF BREATH     No current facility-administered medications for this encounter.     No Known Allergies  Social History   Socioeconomic History  . Marital status: Single    Spouse name: Not on file  . Number of children: Not on file  . Years of education: Not on file  .  Highest education level: Not on file  Occupational History  . Not on file  Social Needs  . Financial resource strain: Not on file  . Food insecurity    Worry: Not on file    Inability: Not on file  . Transportation needs    Medical: Not on file    Non-medical: Not on file  Tobacco Use  . Smoking status: Never Smoker  . Smokeless tobacco: Never Used  Substance and Sexual Activity  . Alcohol use: No  . Drug use: No  . Sexual activity: Not on file  Lifestyle  . Physical activity  Days per week: Not on file    Minutes per session: Not on file  . Stress: Not on file  Relationships  . Social Musician on phone: Not on file    Gets together: Not on file    Attends religious service: Not on file    Active member of club or organization: Not on file    Attends meetings of clubs or organizations: Not on file    Relationship status: Not on file  . Intimate partner violence    Fear of current or ex partner: Not on file    Emotionally abused: Not on file    Physically abused: Not on file    Forced sexual activity: Not on file  Other Topics Concern  . Not on file  Social History Narrative  . Not on file    Family History  Problem Relation Age of Onset  . Heart failure Mother     ROS- All systems are reviewed and negative except as per the HPI above  Physical Exam: Vitals:   03/27/19 1059  BP: (!) 152/74  Pulse: 98  SpO2: 98%  Weight: 90.7 kg  Height: 5\' 6"  (1.676 m)    GEN- The patient is well appearing, alert and oriented x 3 today.   Head- normocephalic, atraumatic Eyes-  Sclera clear, conjunctiva pink Ears- hearing intact Oropharynx- clear Neck- supple, no JVP Lymph- no cervical lymphadenopathy Lungs- Clear to ausculation bilaterally, normal work of breathing Heart- regular rate and rhythm, no murmurs, rubs or gallops, PMI not laterally displaced GI- soft, NT, ND, + BS Extremities- no clubbing, cyanosis, or edema MS- no significant deformity or  atrophy Skin- no rash or lesion Psych- euthymic mood, full affect Neuro- strength and sensation are intact  EKG-NSR at 70 bpm, PR int 204 ms, qrs int 90 ms, qtc 442 ms  Echo--8/17- Left ventricle: The cavity size was normal. Wall thickness was   increased in a pattern of mild LVH. Systolic function was normal.   The estimated ejection fraction was in the range of 60% to 65%.   Indeterminant diastolic function (atrial fibrillation). Wall   motion was normal; there were no regional wall motion   abnormalities. - Aortic valve: There was no stenosis. - Mitral valve: There was trivial regurgitation. - Left atrium: The atrium was severely dilated, 41 mm, diameter, LA volume 103 ml - Right ventricle: The cavity size was normal. Systolic function   was normal. - Right atrium: The atrium was severely dilated. - Pulmonary arteries: No complete TR doppler jet so unable to   estimate PA systolic pressure. - Inferior vena cava: The vessel was normal in size. The   respirophasic diameter changes were in the normal range (>= 50%),   consistent with normal central venous pressure. - Pericardium, extracardiac: Prominent epicardial adipose tissue.  Impressions:  - The patient was in atrial fibrillation. Normal LV size with mild   LV hypertrophy. EF 60-65%. Normal RV size and systolic function.   No significant valvular abnormalities. Severe biatrial   enlargement.   Stress test-Nuclear stress EF: 74%.  There was no ST segment deviation noted during stress.  The study is normal.  The left ventricular ejection fraction is hyperdynamic (>65%).   1. EF 74%, normal wall motion.  2. No evidence for ischemia or infarction on perfusion images.   Normal study.   Assessment and Plan:  1. Persistent  afib Had been maintaining SR on flecainide 50 mg bid Continue cardizem 120  mg qd Continue eliquis 5 mg bid,  chadsvasc score is at least 4  2. HTN Stable  Continue losartan 25 mg daily   F/u in 6 months   Lupita Leash C. Matthew Folks [facility] [address] [place], [place] [address] [phone]

## 2019-04-03 ENCOUNTER — Telehealth: Payer: Self-pay | Admitting: Orthopedic Surgery

## 2019-04-03 IMAGING — DX DG CHEST 2V
2 series · 2 of 2 positions shown · non-contrast
Comparison: 01/23/2016

CLINICAL DATA: COUGH, Reports of non-productive cough/nasal
drainage that started yesterday. Denies fever. HISTORY OF
ATRIAL-FIB, HTN

EXAM:
CHEST  2 VIEW

[chest pa]
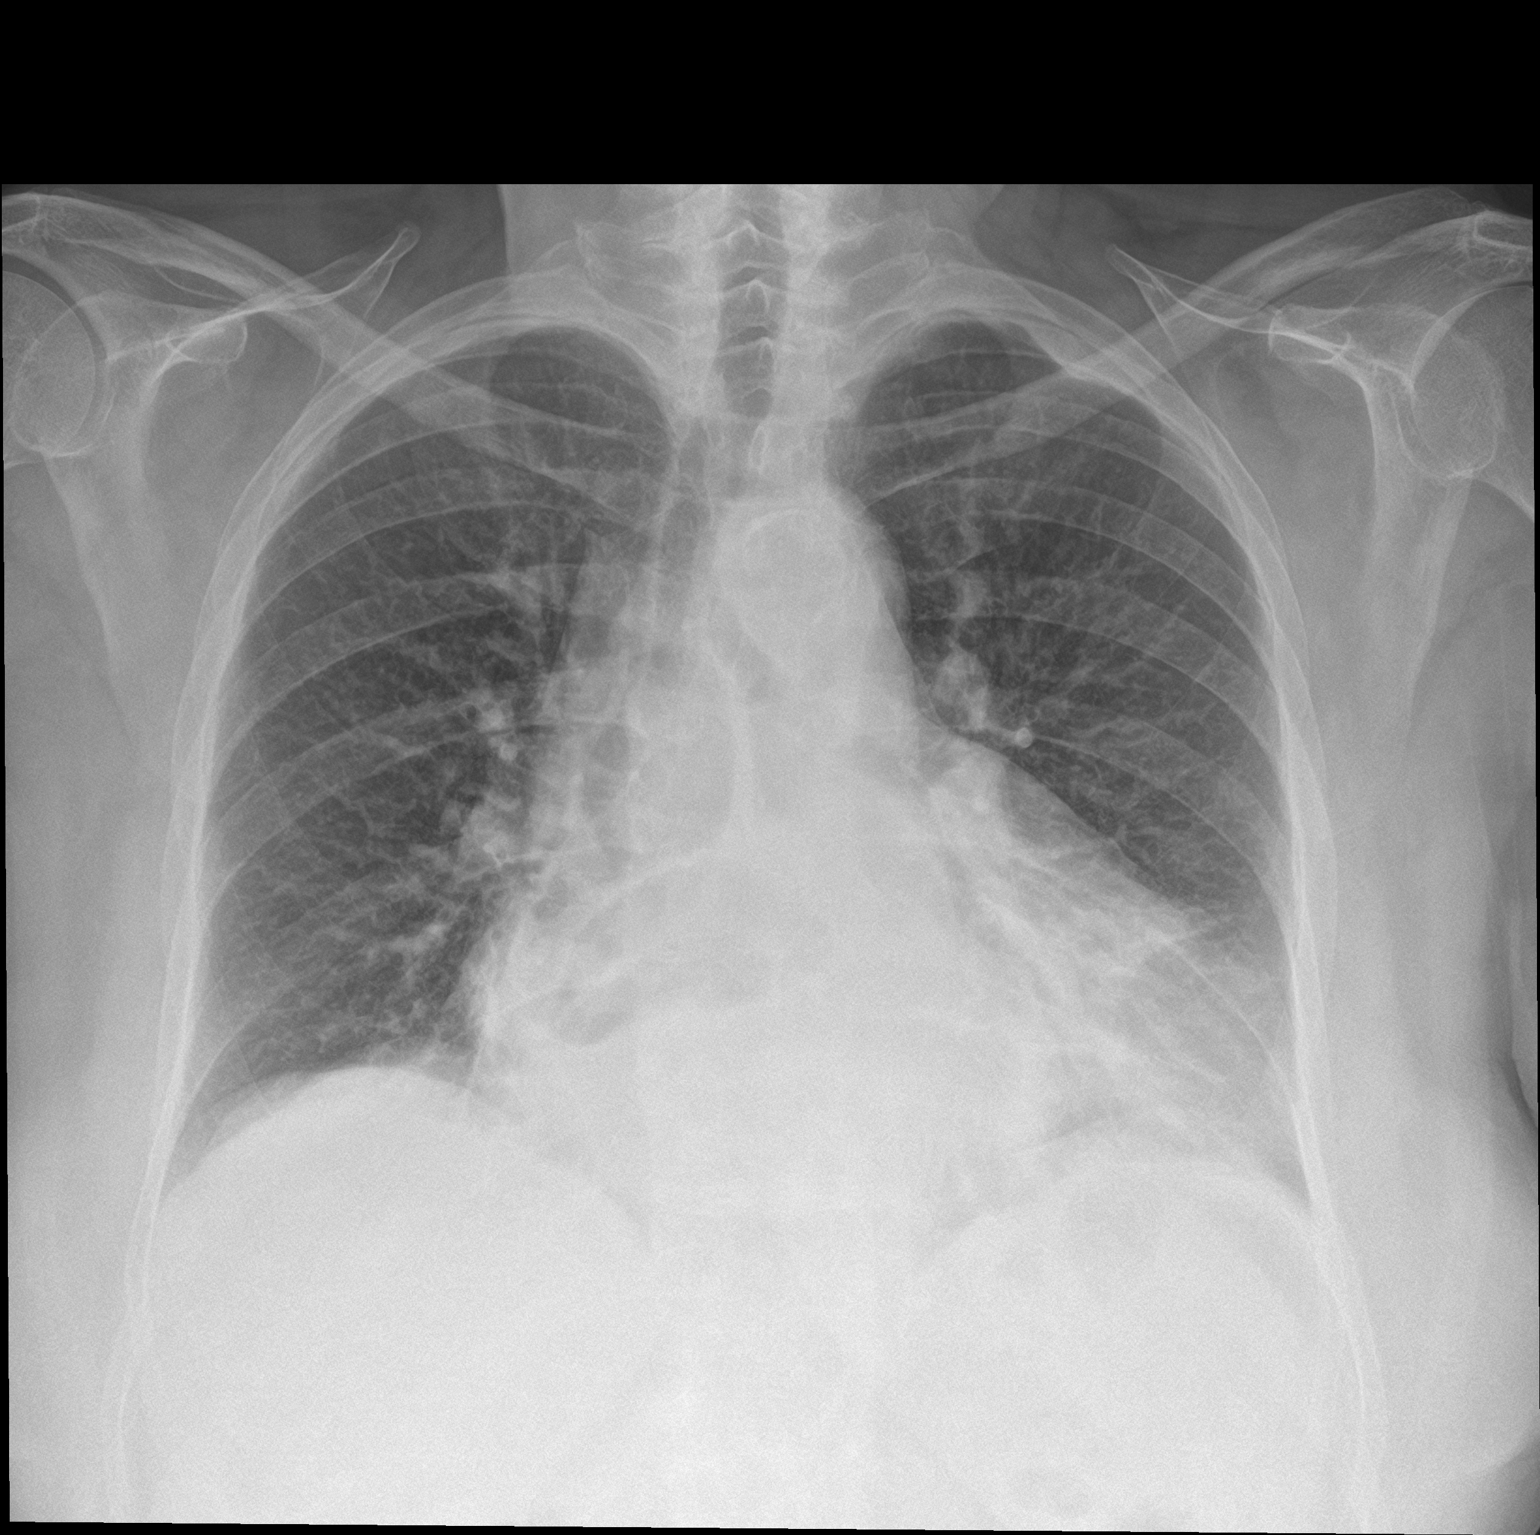

[chest lat]
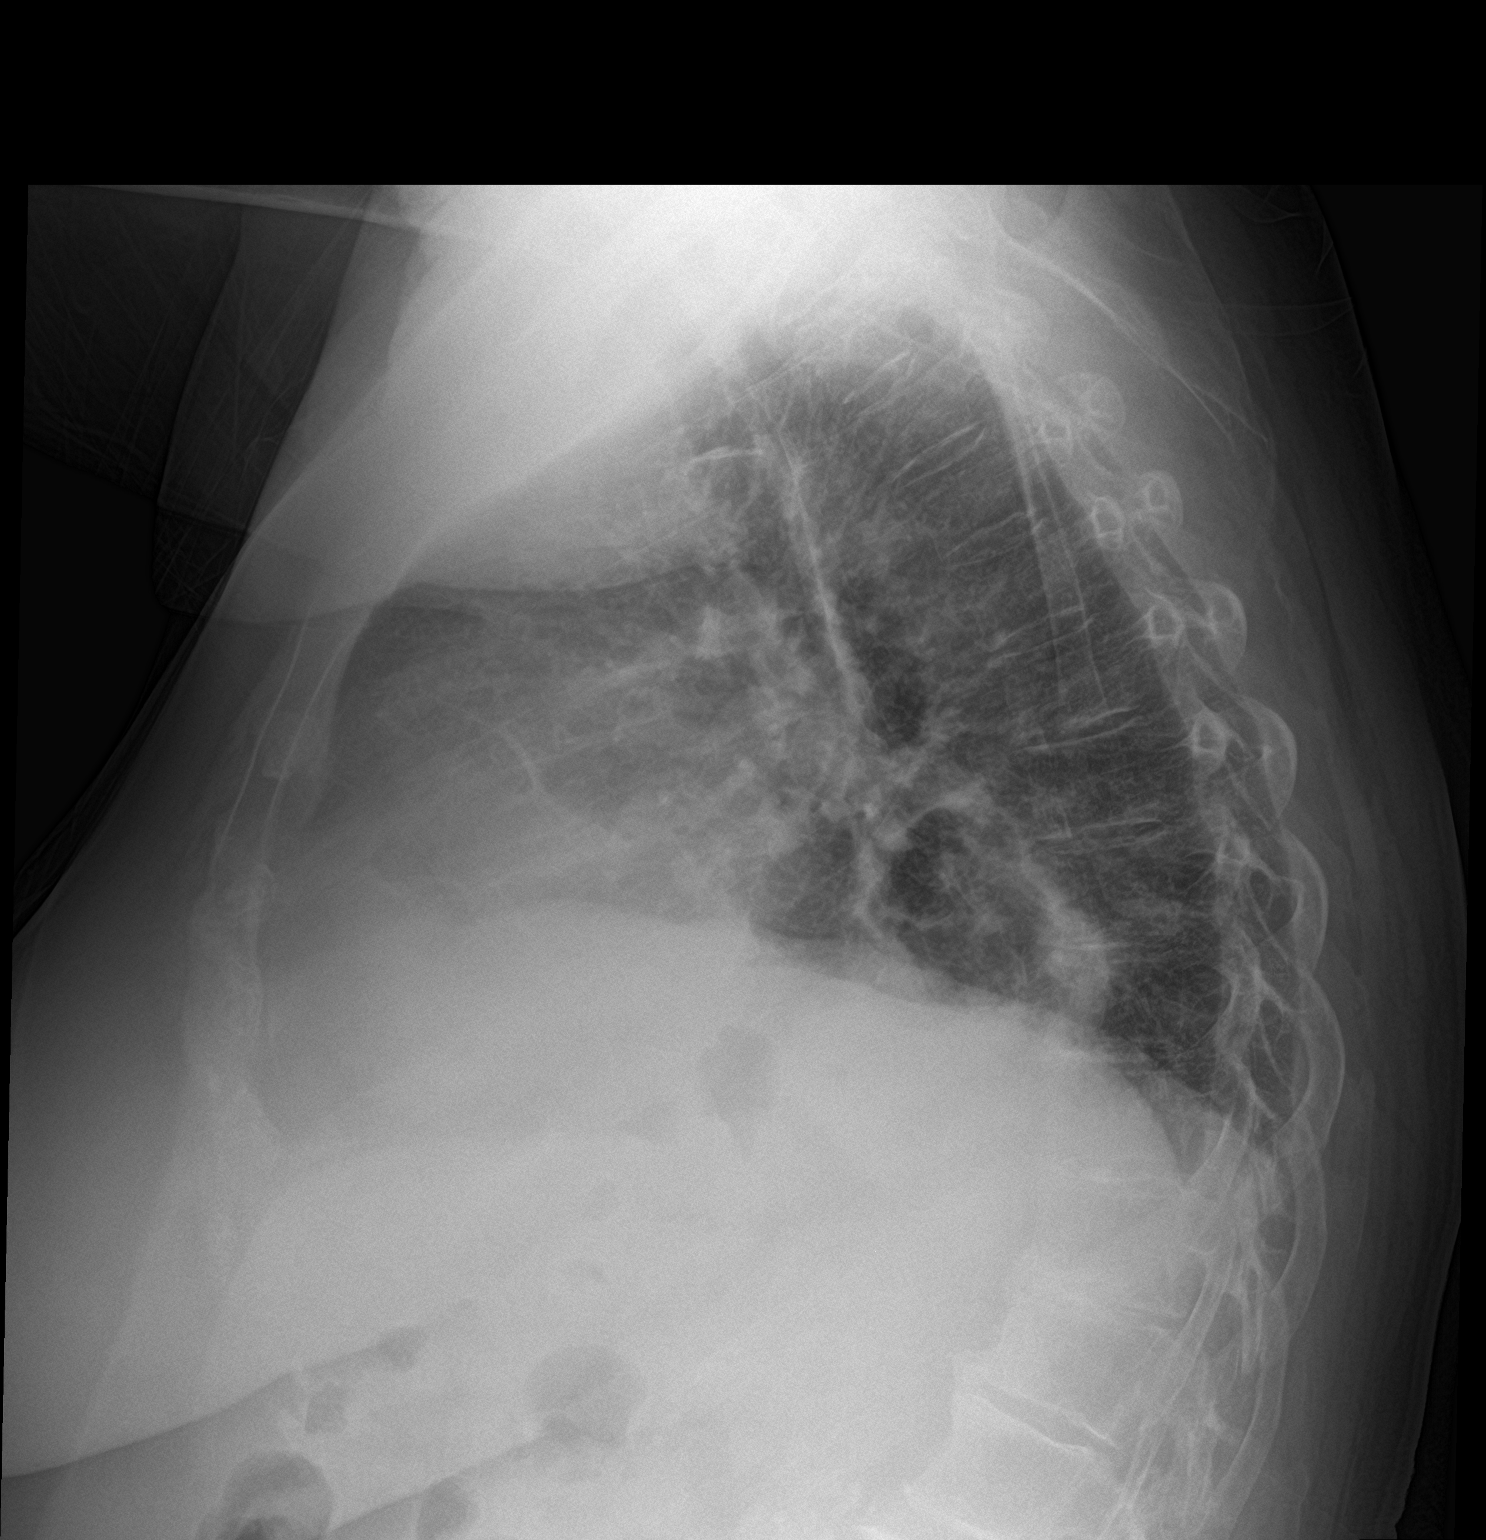

[2 of 2 positions shown; findings below may reference images not displayed]

FINDINGS: The heart is enlarged. Large, complex hiatal hernia is present.
There is increased opacity at the medial left lung base, compatible
with atelectasis or possible early infiltrate.
IMPRESSION: 1. Stable cardiomegaly.
2. Left lower lobe atelectasis or early infiltrate.
3. Large hiatal hernia.

## 2019-04-03 NOTE — Telephone Encounter (Signed)
Patient called in response to Amy's voice message regarding the referral by Dr Aline Brochure to vascular specialist. Patient states she is not going to schedule with this specialist at this time, as she has consulted with her "A-fib doctor", Dr Roderic Palau, and said Dr Kayleen Memos relayed it was related to her arthritis. (I have not updated the referral with this information)

## 2019-06-10 ENCOUNTER — Ambulatory Visit (HOSPITAL_COMMUNITY): Payer: Medicare Other | Admitting: Nurse Practitioner

## 2019-07-03 ENCOUNTER — Other Ambulatory Visit (HOSPITAL_COMMUNITY): Payer: Self-pay | Admitting: Nurse Practitioner

## 2019-07-24 ENCOUNTER — Other Ambulatory Visit (HOSPITAL_COMMUNITY): Payer: Self-pay | Admitting: *Deleted

## 2019-07-24 MED ORDER — LOSARTAN POTASSIUM 25 MG PO TABS: 25.0000 mg | ORAL_TABLET | Freq: Every day | ORAL | 6 refills | Status: DC

## 2019-09-02 ENCOUNTER — Other Ambulatory Visit (HOSPITAL_COMMUNITY): Payer: Self-pay | Admitting: Nurse Practitioner

## 2019-09-08 ENCOUNTER — Telehealth (HOSPITAL_COMMUNITY): Payer: Self-pay | Admitting: *Deleted

## 2019-09-08 MED ORDER — LOSARTAN POTASSIUM 50 MG PO TABS: 50.0000 mg | ORAL_TABLET | Freq: Every day | ORAL | 3 refills | Status: DC

## 2019-09-08 NOTE — Telephone Encounter (Signed)
Patient having issues with elevated BP 150-180/80-90s intermittently dizzy. Avoiding salty foods. Discussed with Rudi Coco NP - will increase losartan to 50mg  a day. Pt in agreement. Has follow up in early April.

## 2019-09-25 ENCOUNTER — Ambulatory Visit (HOSPITAL_COMMUNITY): Payer: Medicare Other | Admitting: Nurse Practitioner

## 2019-09-29 ENCOUNTER — Ambulatory Visit (HOSPITAL_COMMUNITY)
Admission: RE | Admit: 2019-09-29 | Discharge: 2019-09-29 | Disposition: A | Discharge status: Home / Self Care | Payer: Medicare Other | Source: Ambulatory Visit | Attending: Nurse Practitioner | Admitting: Nurse Practitioner

## 2019-09-29 ENCOUNTER — Encounter (HOSPITAL_COMMUNITY): Payer: Self-pay | Admitting: Nurse Practitioner

## 2019-09-29 ENCOUNTER — Other Ambulatory Visit (HOSPITAL_COMMUNITY): Payer: Self-pay | Admitting: *Deleted

## 2019-09-29 VITALS — BP 142/70 | HR 76 | Ht 66.0 in | Wt 195.2 lb

## 2019-09-29 DIAGNOSIS — I4819 Other persistent atrial fibrillation: Secondary | ICD-10-CM | POA: Insufficient documentation

## 2019-09-29 DIAGNOSIS — Z7901 Long term (current) use of anticoagulants: Secondary | ICD-10-CM | POA: Diagnosis not present

## 2019-09-29 DIAGNOSIS — I10 Essential (primary) hypertension: Secondary | ICD-10-CM | POA: Insufficient documentation

## 2019-09-29 DIAGNOSIS — E785 Hyperlipidemia, unspecified: Secondary | ICD-10-CM | POA: Insufficient documentation

## 2019-09-29 DIAGNOSIS — Z8249 Family history of ischemic heart disease and other diseases of the circulatory system: Secondary | ICD-10-CM | POA: Insufficient documentation

## 2019-09-29 DIAGNOSIS — D6869 Other thrombophilia: Secondary | ICD-10-CM

## 2019-09-29 DIAGNOSIS — I480 Paroxysmal atrial fibrillation: Secondary | ICD-10-CM

## 2019-09-29 DIAGNOSIS — Z79899 Other long term (current) drug therapy: Secondary | ICD-10-CM | POA: Insufficient documentation

## 2019-09-29 MED ORDER — DILTIAZEM HCL ER COATED BEADS 120 MG PO CP24: ORAL_CAPSULE | ORAL | 2 refills | Status: DC

## 2019-09-29 NOTE — Progress Notes (Addendum)
Patient ID: Elizabeth Sherman, female   DOB: 1940/06/25, 79 y.o.   MRN: [id number]      Primary Care Physician: Maximiano Coss, MD Referring Physician:  ER f/u   Elizabeth Sherman is a 79 y.o. female with a h/o new onset afib 01/14/16. This was dx at her PCP, picked up at the exam, pt was unaware. She then decided she would go to the ER for her heart to be evaluated. She was asymptomatic. She was given one eliquis for that evening, rx for cardizem, and was referred here.  She was seen in the afib clinic for the first time, 01/20/16, by then on cardizem and was in afib at 53 bpm,  stated that she feels well and denies any shortness of breath, or fatigue. 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 She does not thinks she snores, no smoking, no alcohol, no large amounts of caffeine.Chadsvasc score of at least 4.  Pt here for f/u, 8/14/`7 and is feeling well. She had one episode of chest pain for which she was evaluated for at [facility] hospital in the ER, 01/23/16, and it had resolved by the time she reached the ER and has not returned. She is still unaware of afib but would like to attempt cardioversion when she has bee on blood thinner x 3 weeks. No issues with the anticoagulant and is taking regularly.  Returns to the afib clinic 02/27/16, f/u cardioversion. This was successful and she remains in SR. She thinks she may see slight improvement in SR but no significant change in her overall energy level.Remains on apixaban.  F/u in a fib clinc 05/28/16. She has been staying in SR. She feels well. Continues on eliquis, no bleeding issues.  F/u in afib clinic 02/22/17 and she is in afib at 92 bpm. She is unaware. She is under extra stress from her daughter being diagnosed with cancer and is coming with her to the Ca center at [facility] 5 days a week, from [place].Marland Kitchen She continues on eliquis.  F/u in the afib clinic, 03/13/17. She continues with afib, rate controlled. She is minimally  symptomatic with this but would like to undergo another cardioversion, however, when to schedule it is up in the air, as her schedule is so full transporting her daughter to radiation. No missed dosed of eliquis.  F/u in afib clinic, 04/18/17, pt had been set up for cardioversion several weeks ago but called and cancelled as she developed an URI. She is now getting over it and is back in the afib clinic to further discuss. I am concerned that now she has been in afib for 2 months and her atrium are severly dilated. I am surprised that she stayed in SR as well as she did since last cardioversion about 2 years ago.  F/u in afib clinic 05/02/17, she has been started on flecainide  50 mg bid, after a low risk stress test. EKG shows no interval change but  remains in afib, rate controlled. She is over her URI and is feeling better from that standpoint. No missed doses of eliquis.  F/u in afib clinic, after successful cardioversion 9/01,but she feels dizzy despite being in SR.Marland Kitchen She called office last week with these c/o and flecainide was reduced to 50 mg bid. Ekg shows SR at 85 bpm, intervals OK. Will try to reduce to 25 mg bid and see if symptoms improve. She refuses a flecainide level today.  F/u  in fib clinic, 08/12/17. She is staying in SR. She feels well. She called in after cardioversion c/o dizziness and flecainide was reduced to 25 mg bid. This has kept her in SR and eliminated her c/o dizziness.  F/u in afib clinic 02/03/18. She is doing well staying in SR on flecainide. Voices no complaints today.  F/u in afib clinic, 12/09/18 and is in SR. She has not noted any afib and is staying in rhythm on flecainide 50 mg bid. No issues with DOAC. She was having some dizziness and her PCP reduced her losartan to 25 mg daily and this resolved the dizziness which reasonable BP control.  F/u in afib clinic, 03/27/19. She feels well. No awareness of afib. Continues on flecainide. No bleeding issues with  eliquis.  F/u 09/29/19. She states no awareness of afib. Continues on flecainide. No issues with bleeding on eliquis with a CHA2DS2VASc score of 4.  Today, she denies symptoms of palpitations, chest pain, shortness of breath, orthopnea, PND, lower extremity edema, dizziness, presyncope, syncope, or neurologic sequela. The patient is tolerating medications without difficulties and is otherwise without complaint today.   Past Medical History:  Diagnosis Date  . A-fib (HCC)   . Depression   . Hyperlipidemia   . Hypertension    Past Surgical History:  Procedure Laterality Date  . ABDOMINAL HYSTERECTOMY    . APPENDECTOMY    . CARDIOVERSION N/A 02/17/2016   Procedure: CARDIOVERSION;  Surgeon: Chrystie Nose, MD;  Location: [facility] ENDOSCOPY;  Service: Cardiovascular;  Laterality: N/A;  . CARDIOVERSION N/A 05/14/2017   Procedure: CARDIOVERSION;  Surgeon: Laurey Morale, MD;  Location: [facility] ENDOSCOPY;  Service: Cardiovascular;  Laterality: N/A;    Current Outpatient Medications  Medication Sig Dispense Refill  . busPIRone (BUSPAR) 5 MG tablet Take 1 tablet (5 mg total) by mouth 3 (three) times daily as needed. 15 tablet 1  . CARTIA XT 120 MG 24 hr capsule TAKE 1 CAPSULE BY MOUTH DAILY - NEED TO MAKE APPOINTMENT 90 capsule 6  . Cholecalciferol (VITAMIN D3) 2000 units TABS Take 2,000 Units 2 (two) times a week by mouth.    Everlene Balls 5 MG TABS tablet Take 1 tablet by mouth twice daily 60 tablet 0  . flecainide (TAMBOCOR) 50 MG tablet Take 1 tablet by mouth twice daily 60 tablet 3  . losartan (COZAAR) 50 MG tablet Take 1 tablet (50 mg total) by mouth daily. 30 tablet 3  . omeprazole (PRILOSEC OTC) 20 MG tablet Take 20 mg by mouth daily before breakfast.      No current facility-administered medications for this encounter.    No Known Allergies  Social History   Socioeconomic History  . Marital status: Single    Spouse name: Not on file  . Number of children: Not on file  . Years of  education: Not on file  . Highest education level: Not on file  Occupational History  . Not on file  Tobacco Use  . Smoking status: Never Smoker  . Smokeless tobacco: Never Used  Substance and Sexual Activity  . Alcohol use: No  . Drug use: No  . Sexual activity: Not on file  Other Topics Concern  . Not on file  Social History Narrative  . Not on file   Social Determinants of Health   Financial Resource Strain:   . Difficulty of Paying Living Expenses:   Food Insecurity:   . Worried About Programme researcher, broadcasting/film/video in the Last Year:   .  Ran Out of Food in the Last Year:   Transportation Needs:   . Film/video editor (Medical):   Marland Kitchen Lack of Transportation (Non-Medical):   Physical Activity:   . Days of Exercise per Week:   . Minutes of Exercise per Session:   Stress:   . Feeling of Stress :   Social Connections:   . Frequency of Communication with Friends and Family:   . Frequency of Social Gatherings with Friends and Family:   . Attends Religious Services:   . Active Member of Clubs or Organizations:   . Attends Archivist Meetings:   Marland Kitchen Marital Status:   Intimate Partner Violence:   . Fear of Current or Ex-Partner:   . Emotionally Abused:   Marland Kitchen Physically Abused:   . Sexually Abused:     Family History  Problem Relation Age of Onset  . Heart failure Mother     ROS- All systems are reviewed and negative except as per the HPI above  Physical Exam: Vitals:   09/29/19 1146  BP: (!) 142/70  Pulse: 76  Weight: 88.5 kg  Height: 5\' 6"  (1.676 m)    GEN- The patient is well appearing, alert and oriented x 3 today.   Head- normocephalic, atraumatic Eyes-  Sclera clear, conjunctiva pink Ears- hearing intact Oropharynx- clear Neck- supple, no JVP Lymph- no cervical lymphadenopathy Lungs- Clear to ausculation bilaterally, normal work of breathing Heart- regular rate and rhythm, no murmurs, rubs or gallops, PMI not laterally displaced GI- soft, NT, ND, +  BS Extremities- no clubbing, cyanosis, or edema MS- no significant deformity or atrophy Skin- no rash or lesion Psych- euthymic mood, full affect Neuro- strength and sensation are intact  EKG-NSR at 76 bpm, PR int 206 ms, qrs int 98 ms, qtc 454 ms  Echo--8/17- Left ventricle: The cavity size was normal. Wall thickness was   increased in a pattern of mild LVH. Systolic function was normal.   The estimated ejection fraction was in the range of 60% to 65%.   Indeterminant diastolic function (atrial fibrillation). Wall   motion was normal; there were no regional wall motion   abnormalities. - Aortic valve: There was no stenosis. - Mitral valve: There was trivial regurgitation. - Left atrium: The atrium was severely dilated, 41 mm, diameter, LA volume 103 ml - Right ventricle: The cavity size was normal. Systolic function   was normal. - Right atrium: The atrium was severely dilated. - Pulmonary arteries: No complete TR doppler jet so unable to   estimate PA systolic pressure. - Inferior vena cava: The vessel was normal in size. The   respirophasic diameter changes were in the normal range (>= 50%),   consistent with normal central venous pressure. - Pericardium, extracardiac: Prominent epicardial adipose tissue.  Impressions:  - The patient was in atrial fibrillation. Normal LV size with mild   LV hypertrophy. EF 60-65%. Normal RV size and systolic function.   No significant valvular abnormalities. Severe biatrial   enlargement.   Stress test-Nuclear stress EF: 74%.  There was no ST segment deviation noted during stress.  The study is normal.  The left ventricular ejection fraction is hyperdynamic (>65%).   1. EF 74%, normal wall motion.  2. No evidence for ischemia or infarction on perfusion images.   Normal study.   Assessment and Plan:  1. Persistent  afib Had been maintaining SR on flecainide 50 mg bid Continue cardizem 120 mg qd Continue eliquis 5 mg bid,   chadsvasc  score is at least 4  Will call PCP to get latest bmet/cbc as she says recently drawn  2. HTN Stable  Continue losartan 50  mg daily  F/u in 6 months   Addendum: Lab work reviewed form PCP office dated 05/12/19: creatinine at 1.21, BUN, 18, K+ 4.3, TSH wnl, CBC hemoglobin at 11.9/hct at 11.9/ plts 443   Lucia Harm [facility] [address] [place], [place] [address] [phone]

## 2019-10-06 NOTE — Addendum Note (Signed)
Encounter addended by: Newman Nip, NP on: 10/06/2019 2:03 PM  Actions taken: Clinical Note Signed

## 2019-10-30 ENCOUNTER — Other Ambulatory Visit (HOSPITAL_COMMUNITY): Payer: Self-pay | Admitting: *Deleted

## 2019-10-30 ENCOUNTER — Other Ambulatory Visit (HOSPITAL_COMMUNITY): Payer: Self-pay | Admitting: Nurse Practitioner

## 2019-10-30 MED ORDER — APIXABAN 5 MG PO TABS: 5.0000 mg | ORAL_TABLET | Freq: Two times a day (BID) | ORAL | 6 refills | Status: DC

## 2020-03-25 ENCOUNTER — Ambulatory Visit (HOSPITAL_COMMUNITY): Payer: Medicare Other | Admitting: Nurse Practitioner

## 2020-04-01 ENCOUNTER — Encounter (HOSPITAL_COMMUNITY): Payer: Self-pay | Admitting: Nurse Practitioner

## 2020-04-01 ENCOUNTER — Ambulatory Visit (HOSPITAL_COMMUNITY)
Admission: RE | Admit: 2020-04-01 | Discharge: 2020-04-01 | Disposition: A | Discharge status: Home / Self Care | Payer: Medicare Other | Source: Ambulatory Visit | Attending: Nurse Practitioner | Admitting: Nurse Practitioner

## 2020-04-01 ENCOUNTER — Other Ambulatory Visit: Payer: Self-pay

## 2020-04-01 VITALS — BP 150/62 | HR 61 | Ht 66.0 in | Wt 193.6 lb

## 2020-04-01 DIAGNOSIS — I4819 Other persistent atrial fibrillation: Secondary | ICD-10-CM | POA: Diagnosis not present

## 2020-04-01 DIAGNOSIS — I480 Paroxysmal atrial fibrillation: Secondary | ICD-10-CM

## 2020-04-01 DIAGNOSIS — Z79899 Other long term (current) drug therapy: Secondary | ICD-10-CM | POA: Diagnosis not present

## 2020-04-01 DIAGNOSIS — I10 Essential (primary) hypertension: Secondary | ICD-10-CM | POA: Insufficient documentation

## 2020-04-01 DIAGNOSIS — E785 Hyperlipidemia, unspecified: Secondary | ICD-10-CM | POA: Diagnosis not present

## 2020-04-01 DIAGNOSIS — D6869 Other thrombophilia: Secondary | ICD-10-CM

## 2020-04-01 DIAGNOSIS — Z7901 Long term (current) use of anticoagulants: Secondary | ICD-10-CM | POA: Diagnosis not present

## 2020-04-01 NOTE — Progress Notes (Signed)
Patient ID: Elizabeth Sherman, female   DOB: 1940/06/25, 79 y.o.   MRN: [id number]      Primary Care Physician: Maximiano Coss, MD Referring Physician:  ER f/u   Elizabeth Sherman is a 79 y.o. female with a h/o new onset afib 01/14/16. This was dx at her PCP, picked up at the exam, pt was unaware. She then decided she would go to the ER for her heart to be evaluated. She was asymptomatic. She was given one eliquis for that evening, rx for cardizem, and was referred here.  She was seen in the afib clinic for the first time, 01/20/16, by then on cardizem and was in afib at 53 bpm,  stated that she feels well and denies any shortness of breath, or fatigue. 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 She does not thinks she snores, no smoking, no alcohol, no large amounts of caffeine.Chadsvasc score of at least 4.  Pt here for f/u, 8/14/`7 and is feeling well. She had one episode of chest pain for which she was evaluated for at [facility] hospital in the ER, 01/23/16, and it had resolved by the time she reached the ER and has not returned. She is still unaware of afib but would like to attempt cardioversion when she has bee on blood thinner x 3 weeks. No issues with the anticoagulant and is taking regularly.  Returns to the afib clinic 02/27/16, f/u cardioversion. This was successful and she remains in SR. She thinks she may see slight improvement in SR but no significant change in her overall energy level.Remains on apixaban.  F/u in a fib clinc 05/28/16. She has been staying in SR. She feels well. Continues on eliquis, no bleeding issues.  F/u in afib clinic 02/22/17 and she is in afib at 92 bpm. She is unaware. She is under extra stress from her daughter being diagnosed with cancer and is coming with her to the Ca center at [facility] 5 days a week, from [place].Marland Kitchen She continues on eliquis.  F/u in the afib clinic, 03/13/17. She continues with afib, rate controlled. She is minimally  symptomatic with this but would like to undergo another cardioversion, however, when to schedule it is up in the air, as her schedule is so full transporting her daughter to radiation. No missed dosed of eliquis.  F/u in afib clinic, 04/18/17, pt had been set up for cardioversion several weeks ago but called and cancelled as she developed an URI. She is now getting over it and is back in the afib clinic to further discuss. I am concerned that now she has been in afib for 2 months and her atrium are severly dilated. I am surprised that she stayed in SR as well as she did since last cardioversion about 2 years ago.  F/u in afib clinic 05/02/17, she has been started on flecainide  50 mg bid, after a low risk stress test. EKG shows no interval change but  remains in afib, rate controlled. She is over her URI and is feeling better from that standpoint. No missed doses of eliquis.  F/u in afib clinic, after successful cardioversion 9/01,but she feels dizzy despite being in SR.Marland Kitchen She called office last week with these c/o and flecainide was reduced to 50 mg bid. Ekg shows SR at 85 bpm, intervals OK. Will try to reduce to 25 mg bid and see if symptoms improve. She refuses a flecainide level today.  F/u  in fib clinic, 08/12/17. She is staying in SR. She feels well. She called in after cardioversion c/o dizziness and flecainide was reduced to 25 mg bid. This has kept her in SR and eliminated her c/o dizziness.  F/u in afib clinic 02/03/18. She is doing well staying in SR on flecainide. Voices no complaints today.  F/u in afib clinic, 12/09/18 and is in SR. She has not noted any afib and is staying in rhythm on flecainide 50 mg bid. No issues with DOAC. She was having some dizziness and her PCP reduced her losartan to 25 mg daily and this resolved the dizziness which reasonable BP control.  F/u in afib clinic, 03/27/19. She feels well. No awareness of afib. Continues on flecainide. No bleeding issues with  eliquis.  F/u 09/29/19. She states no awareness of afib. Continues on flecainide. No issues with bleeding on eliquis with a CHA2DS2VASc score of 4.  F/u in afib clinic, 04/01/20. She is staying in SR on flecainide. No awareness of afib. No issues with bleeding, remains on 5 mg eliquis bid.  CHA2DS2VASc score is  at least 4.      Today, she denies symptoms of palpitations, chest pain, shortness of breath, orthopnea, PND, lower extremity edema, dizziness, presyncope, syncope, or neurologic sequela. The patient is tolerating medications without difficulties and is otherwise without complaint today.   Past Medical History:  Diagnosis Date  . A-fib (HCC)   . Depression   . Hyperlipidemia   . Hypertension    Past Surgical History:  Procedure Laterality Date  . ABDOMINAL HYSTERECTOMY    . APPENDECTOMY    . CARDIOVERSION N/A 02/17/2016   Procedure: CARDIOVERSION;  Surgeon: Chrystie Nose, MD;  Location: [facility] ENDOSCOPY;  Service: Cardiovascular;  Laterality: N/A;  . CARDIOVERSION N/A 05/14/2017   Procedure: CARDIOVERSION;  Surgeon: Laurey Morale, MD;  Location: [facility] ENDOSCOPY;  Service: Cardiovascular;  Laterality: N/A;    Current Outpatient Medications  Medication Sig Dispense Refill  . apixaban (ELIQUIS) 5 MG TABS tablet Take 1 tablet (5 mg total) by mouth 2 (two) times daily. 60 tablet 6  . busPIRone (BUSPAR) 5 MG tablet Take 1 tablet (5 mg total) by mouth 3 (three) times daily as needed. (Patient taking differently: Take 5 mg by mouth as needed. ) 15 tablet 1  . Cholecalciferol (VITAMIN D3) 2000 units TABS Take 2,000 Units 2 (two) times a week by mouth.    . diltiazem (CARTIA XT) 120 MG 24 hr capsule TAKE 1 CAPSULE BY MOUTH DAILY 90 capsule 2  . flecainide (TAMBOCOR) 50 MG tablet Take 1 tablet by mouth twice daily 60 tablet 6  . losartan (COZAAR) 50 MG tablet Take 1 tablet (50 mg total) by mouth daily. 30 tablet 3  . traMADol (ULTRAM) 50 MG tablet Take 50 mg by mouth as needed.      No  current facility-administered medications for this encounter.    No Known Allergies  Social History   Socioeconomic History  . Marital status: Single    Spouse name: Not on file  . Number of children: Not on file  . Years of education: Not on file  . Highest education level: Not on file  Occupational History  . Not on file  Tobacco Use  . Smoking status: Never Smoker  . Smokeless tobacco: Never Used  Substance and Sexual Activity  . Alcohol use: No  . Drug use: No  . Sexual activity: Not on file  Other Topics Concern  . Not  on file  Social History Narrative  . Not on file   Social Determinants of Health   Financial Resource Strain:   . Difficulty of Paying Living Expenses: Not on file  Food Insecurity:   . Worried About Programme researcher, broadcasting/film/video in the Last Year: Not on file  . Ran Out of Food in the Last Year: Not on file  Transportation Needs:   . Lack of Transportation (Medical): Not on file  . Lack of Transportation (Non-Medical): Not on file  Physical Activity:   . Days of Exercise per Week: Not on file  . Minutes of Exercise per Session: Not on file  Stress:   . Feeling of Stress : Not on file  Social Connections:   . Frequency of Communication with Friends and Family: Not on file  . Frequency of Social Gatherings with Friends and Family: Not on file  . Attends Religious Services: Not on file  . Active Member of Clubs or Organizations: Not on file  . Attends Banker Meetings: Not on file  . Marital Status: Not on file  Intimate Partner Violence:   . Fear of Current or Ex-Partner: Not on file  . Emotionally Abused: Not on file  . Physically Abused: Not on file  . Sexually Abused: Not on file    Family History  Problem Relation Age of Onset  . Heart failure Mother     ROS- All systems are reviewed and negative except as per the HPI above  Physical Exam: Vitals:   04/01/20 1141  BP: (!) 150/62  Pulse: 61  Weight: 87.8 kg  Height: 5\' 6"   (1.676 m)    GEN- The patient is well appearing, alert and oriented x 3 today.   Head- normocephalic, atraumatic Eyes-  Sclera clear, conjunctiva pink Ears- hearing intact Oropharynx- clear Neck- supple, no JVP Lymph- no cervical lymphadenopathy Lungs- Clear to ausculation bilaterally, normal work of breathing Heart- regular rate and rhythm, no murmurs, rubs or gallops, PMI not laterally displaced GI- soft, NT, ND, + BS Extremities- no clubbing, cyanosis, or edema MS- no significant deformity or atrophy Skin- no rash or lesion Psych- euthymic mood, full affect Neuro- strength and sensation are intact  EKG-NSR at 61 bpm, PR int 214 ms, qrs int 96 ms, qtc  Echo--8/17- Left ventricle: The cavity size was normal. Wall thickness was   increased in a pattern of mild LVH. Systolic function was normal.   The estimated ejection fraction was in the range of 60% to 65%.   Indeterminant diastolic function (atrial fibrillation). Wall   motion was normal; there were no regional wall motion   abnormalities. - Aortic valve: There was no stenosis. - Mitral valve: There was trivial regurgitation. - Left atrium: The atrium was severely dilated, 41 mm, diameter, LA volume 103 ml - Right ventricle: The cavity size was normal. Systolic function   was normal. - Right atrium: The atrium was severely dilated. - Pulmonary arteries: No complete TR doppler jet so unable to   estimate PA systolic pressure. - Inferior vena cava: The vessel was normal in size. The   respirophasic diameter changes were in the normal range (>= 50%),   consistent with normal central venous pressure. - Pericardium, extracardiac: Prominent epicardial adipose tissue.  Impressions:  - The patient was in atrial fibrillation. Normal LV size with mild   LV hypertrophy. EF 60-65%. Normal RV size and systolic function.   No significant valvular abnormalities. Severe biatrial   enlargement.  Stress test-Nuclear stress  EF: 74%.  There was no ST segment deviation noted during stress.  The study is normal.  The left ventricular ejection fraction is hyperdynamic (>65%).   1. EF 74%, normal wall motion.  2. No evidence for ischemia or infarction on perfusion images.   Normal study.   Assessment and Plan:  1. Persistent  afib Had been maintaining SR on flecainide 50 mg bid Continue cardizem 120 mg qd Continue eliquis 5 mg bid,  chadsvasc score is at least 4  Will call PCP to get latest bmet/cbc as she says recently drawn  2. HTN Elevated here today, blames it on driving down from  [place]  At home 130 systolic range  Continue losartan 50  mg daily  F/u in 6 months    Addendum, labs scanned into  epic from  PCP office in  June reveals a creatinine of 1.57, so she is still ok for now on 5 mg BID, but when she turns 80 2/22 and  if creatinine  is above 1.5 then she will need a lower dose of eliquis   Lupita Leash [facility] [address] [place], [place] [address] [phone]

## 2020-04-10 ENCOUNTER — Other Ambulatory Visit (HOSPITAL_COMMUNITY): Payer: Self-pay | Admitting: Nurse Practitioner

## 2020-05-31 ENCOUNTER — Other Ambulatory Visit (HOSPITAL_COMMUNITY): Payer: Self-pay | Admitting: Nurse Practitioner

## 2020-08-08 ENCOUNTER — Other Ambulatory Visit (HOSPITAL_COMMUNITY): Payer: Self-pay | Admitting: *Deleted

## 2020-08-08 ENCOUNTER — Other Ambulatory Visit (HOSPITAL_COMMUNITY): Payer: Self-pay | Admitting: Nurse Practitioner

## 2020-08-22 ENCOUNTER — Other Ambulatory Visit (HOSPITAL_COMMUNITY): Payer: Self-pay | Admitting: Nurse Practitioner

## 2020-08-31 ENCOUNTER — Ambulatory Visit (HOSPITAL_COMMUNITY)
Admission: RE | Admit: 2020-08-31 | Discharge: 2020-08-31 | Disposition: A | Discharge status: Home / Self Care | Payer: Medicare Other | Source: Ambulatory Visit | Attending: Nurse Practitioner | Admitting: Nurse Practitioner

## 2020-08-31 ENCOUNTER — Encounter (HOSPITAL_COMMUNITY): Payer: Self-pay | Admitting: Nurse Practitioner

## 2020-08-31 ENCOUNTER — Other Ambulatory Visit: Payer: Self-pay

## 2020-08-31 VITALS — BP 148/78 | HR 64 | Ht 66.0 in | Wt 191.0 lb

## 2020-08-31 DIAGNOSIS — Z79899 Other long term (current) drug therapy: Secondary | ICD-10-CM | POA: Insufficient documentation

## 2020-08-31 DIAGNOSIS — I480 Paroxysmal atrial fibrillation: Secondary | ICD-10-CM

## 2020-08-31 DIAGNOSIS — I10 Essential (primary) hypertension: Secondary | ICD-10-CM | POA: Diagnosis not present

## 2020-08-31 DIAGNOSIS — Z7901 Long term (current) use of anticoagulants: Secondary | ICD-10-CM | POA: Diagnosis not present

## 2020-08-31 DIAGNOSIS — I4819 Other persistent atrial fibrillation: Secondary | ICD-10-CM | POA: Diagnosis present

## 2020-08-31 DIAGNOSIS — Z8249 Family history of ischemic heart disease and other diseases of the circulatory system: Secondary | ICD-10-CM | POA: Insufficient documentation

## 2020-08-31 DIAGNOSIS — D6869 Other thrombophilia: Secondary | ICD-10-CM

## 2020-08-31 NOTE — Progress Notes (Addendum)
Patient ID: Elizabeth Sherman, female   DOB: 1940/09/18, 80 y.o.   MRN: [id number]      Primary Care Physician: Maximiano Coss, MD Referring Physician:  ER f/u   DONNIE Sherman is a 80 y.o. female with a h/o new onset afib 01/14/16. This was dx at her PCP, picked up at the exam, pt was unaware. She then decided she would go to the ER for her heart to be evaluated. She was asymptomatic. She was given one eliquis for that evening, rx for cardizem, and was referred here.  She was seen in the afib clinic for the first time, 01/20/16, by then on cardizem and was in afib at 53 bpm,  stated that she feels well and denies any shortness of breath, or fatigue. 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 She does not thinks she snores, no smoking, no alcohol, no large amounts of caffeine.Chadsvasc score of at least 4.  Pt here for f/u, 8/14/`7 and is feeling well. She had one episode of chest pain for which she was evaluated for at [facility] hospital in the ER, 01/23/16, and it had resolved by the time she reached the ER and has not returned. She is still unaware of afib but would like to attempt cardioversion when she has bee on blood thinner x 3 weeks. No issues with the anticoagulant and is taking regularly.  Returns to the afib clinic 02/27/16, f/u cardioversion. This was successful and she remains in SR. She thinks she may see slight improvement in SR but no significant change in her overall energy level.Remains on apixaban.  F/u in a fib clinc 05/28/16. She has been staying in SR. She feels well. Continues on eliquis, no bleeding issues.  F/u in afib clinic 02/22/17 and she is in afib at 92 bpm. She is unaware. She is under extra stress from her daughter being diagnosed with cancer and is coming with her to the Ca center at [facility] 5 days a week, from [place].Marland Kitchen She continues on eliquis.  F/u in the afib clinic, 03/13/17. She continues with afib, rate controlled. She is minimally  symptomatic with this but would like to undergo another cardioversion, however, when to schedule it is up in the air, as her schedule is so full transporting her daughter to radiation. No missed dosed of eliquis.  F/u in afib clinic, 04/18/17, pt had been set up for cardioversion several weeks ago but called and cancelled as she developed an URI. She is now getting over it and is back in the afib clinic to further discuss. I am concerned that now she has been in afib for 2 months and her atrium are severly dilated. I am surprised that she stayed in SR as well as she did since last cardioversion about 2 years ago.  F/u in afib clinic 05/02/17, she has been started on flecainide  50 mg bid, after a low risk stress test. EKG shows no interval change but  remains in afib, rate controlled. She is over her URI and is feeling better from that standpoint. No missed doses of eliquis.  F/u in afib clinic, after successful cardioversion 9/01,but she feels dizzy despite being in SR.Marland Kitchen She called office last week with these c/o and flecainide was reduced to 50 mg bid. Ekg shows SR at 85 bpm, intervals OK. Will try to reduce to 25 mg bid and see if symptoms improve. She refuses a flecainide level today.  F/u  in fib clinic, 08/12/17. She is staying in SR. She feels well. She called in after cardioversion c/o dizziness and flecainide was reduced to 25 mg bid. This has kept her in SR and eliminated her c/o dizziness.  F/u in afib clinic 02/03/18. She is doing well staying in SR on flecainide. Voices no complaints today.  F/u in afib clinic, 12/09/18 and is in SR. She has not noted any afib and is staying in rhythm on flecainide 50 mg bid. No issues with DOAC. She was having some dizziness and her PCP reduced her losartan to 25 mg daily and this resolved the dizziness which reasonable BP control.  F/u in afib clinic, 03/27/19. She feels well. No awareness of afib. Continues on flecainide. No bleeding issues with  eliquis.  F/u 09/29/19. She states no awareness of afib. Continues on flecainide. No issues with bleeding on eliquis with a CHA2DS2VASc score of 4.  F/u in afib clinic, 04/01/20. She is staying in SR on flecainide. No awareness of afib. No issues with bleeding, remains on 5 mg eliquis bid.  CHA2DS2VASc score is  at least 4.  F/u in afib clinic, 08/31/20. She  reports feeling very well. No symptoms of afib. She continues on eliquis with a CHA2DS2VASc score of 4.      Today, she denies symptoms of palpitations, chest pain, shortness of breath, orthopnea, PND, lower extremity edema, dizziness, presyncope, syncope, or neurologic sequela. The patient is tolerating medications without difficulties and is otherwise without complaint today.   Past Medical History:  Diagnosis Date   A-fib (HCC)    Depression    Hyperlipidemia    Hypertension    Past Surgical History:  Procedure Laterality Date   ABDOMINAL HYSTERECTOMY     APPENDECTOMY     CARDIOVERSION N/A 02/17/2016   Procedure: CARDIOVERSION;  Surgeon: Chrystie Nose, MD;  Location: [facility] ENDOSCOPY;  Service: Cardiovascular;  Laterality: N/A;   CARDIOVERSION N/A 05/14/2017   Procedure: CARDIOVERSION;  Surgeon: Laurey Morale, MD;  Location: [facility] ENDOSCOPY;  Service: Cardiovascular;  Laterality: N/A;    Current Outpatient Medications  Medication Sig Dispense Refill   busPIRone (BUSPAR) 5 MG tablet Take 1 tablet (5 mg total) by mouth 3 (three) times daily as needed. 15 tablet 1   Cholecalciferol (VITAMIN D3) 2000 units TABS Take 2,000 Units 2 (two) times a week by mouth.     diltiazem (CARTIA XT) 120 MG 24 hr capsule Take 1 capsule by mouth once daily 90 capsule 2   ELIQUIS 5 MG TABS tablet Take 1 tablet by mouth twice daily 60 tablet 11   flecainide (TAMBOCOR) 50 MG tablet Take 1 tablet by mouth twice daily 60 tablet 6   losartan (COZAAR) 50 MG tablet Take 1 tablet by mouth once daily 30 tablet 6   traMADol (ULTRAM) 50 MG tablet Take 50 mg by  mouth as needed.      No current facility-administered medications for this encounter.    No Known Allergies  Social History   Socioeconomic History   Marital status: Single    Spouse name: Not on file   Number of children: Not on file   Years of education: Not on file   Highest education level: Not on file  Occupational History   Not on file  Tobacco Use   Smoking status: Never Smoker   Smokeless tobacco: Never Used  Substance and Sexual Activity   Alcohol use: No   Drug use: No   Sexual activity: Not on  file  Other Topics Concern   Not on file  Social History Narrative   Not on file   Social Determinants of Health   Financial Resource Strain: Not on file  Food Insecurity: Not on file  Transportation Needs: Not on file  Physical Activity: Not on file  Stress: Not on file  Social Connections: Not on file  Intimate Partner Violence: Not on file    Family History  Problem Relation Age of Onset   Heart failure Mother     ROS- All systems are reviewed and negative except as per the HPI above  Physical Exam: Vitals:   08/31/20 1148  BP: (!) 148/78  Pulse: 64  Weight: 86.6 kg  Height: 5\' 6"  (1.676 m)    GEN- The patient is well appearing, alert and oriented x 3 today.   Head- normocephalic, atraumatic Eyes-  Sclera clear, conjunctiva pink Ears- hearing intact Oropharynx- clear Neck- supple, no JVP Lymph- no cervical lymphadenopathy Lungs- Clear to ausculation bilaterally, normal work of breathing Heart- regular rate and rhythm, no murmurs, rubs or gallops, PMI not laterally displaced GI- soft, NT, ND, + BS Extremities- no clubbing, cyanosis, or edema MS- no significant deformity or atrophy Skin- no rash or lesion Psych- euthymic mood, full affect Neuro- strength and sensation are intact  EKG- SR with first AV block at 64 bpm   Echo--8/17- Left ventricle: The cavity size was normal. Wall thickness was   increased in a pattern of mild LVH. Systolic  function was normal.   The estimated ejection fraction was in the range of 60% to 65%.   Indeterminant diastolic function (atrial fibrillation). Wall   motion was normal; there were no regional wall motion   abnormalities. - Aortic valve: There was no stenosis. - Mitral valve: There was trivial regurgitation. - Left atrium: The atrium was severely dilated, 41 mm, diameter, LA volume 103 ml - Right ventricle: The cavity size was normal. Systolic function   was normal. - Right atrium: The atrium was severely dilated. - Pulmonary arteries: No complete TR doppler jet so unable to   estimate PA systolic pressure. - Inferior vena cava: The vessel was normal in size. The   respirophasic diameter changes were in the normal range (>= 50%),   consistent with normal central venous pressure. - Pericardium, extracardiac: Prominent epicardial adipose tissue.   Impressions:   - The patient was in atrial fibrillation. Normal LV size with mild   LV hypertrophy. EF 60-65%. Normal RV size and systolic function.   No significant valvular abnormalities. Severe biatrial   enlargement.  Stress test-Nuclear stress EF: 74%. There was no ST segment deviation noted during stress. The study is normal. The left ventricular ejection fraction is hyperdynamic (>65%).   1. EF 74%, normal wall motion.  2. No evidence for ischemia or infarction on perfusion images.    Normal study.   Assessment and Plan:  1. Persistent  afib Had been maintaining SR on flecainide 50 mg bid Continue cardizem 120 mg qd Continue eliquis 5 mg bid,  chadsvasc score is at least 4 Bmet/cbc drawn with PCP this am   2. HTN Stable   F/u in 6 months    01-09-1996. [facility] [place] [facility] [address] [place], [place] [id number]

## 2020-11-02 ENCOUNTER — Other Ambulatory Visit (HOSPITAL_COMMUNITY): Payer: Self-pay | Admitting: *Deleted

## 2020-11-02 MED ORDER — LOSARTAN POTASSIUM 50 MG PO TABS: 1.0000 | ORAL_TABLET | Freq: Every day | ORAL | 1 refills | Status: DC

## 2020-12-28 ENCOUNTER — Other Ambulatory Visit (HOSPITAL_COMMUNITY): Payer: Self-pay | Admitting: Nurse Practitioner

## 2021-01-31 ENCOUNTER — Other Ambulatory Visit (HOSPITAL_COMMUNITY): Payer: Self-pay

## 2021-01-31 MED ORDER — APIXABAN 5 MG PO TABS: 5.0000 mg | ORAL_TABLET | Freq: Two times a day (BID) | ORAL | 0 refills | Status: DC

## 2021-03-22 ENCOUNTER — Encounter (HOSPITAL_COMMUNITY): Payer: Self-pay | Admitting: Nurse Practitioner

## 2021-03-22 ENCOUNTER — Ambulatory Visit (HOSPITAL_COMMUNITY)
Admission: RE | Admit: 2021-03-22 | Discharge: 2021-03-22 | Disposition: A | Discharge status: Home / Self Care | Payer: Medicare Other | Source: Ambulatory Visit | Attending: Nurse Practitioner | Admitting: Nurse Practitioner

## 2021-03-22 ENCOUNTER — Other Ambulatory Visit: Payer: Self-pay

## 2021-03-22 VITALS — BP 184/66 | HR 61 | Ht 66.0 in | Wt 195.4 lb

## 2021-03-22 DIAGNOSIS — Z79899 Other long term (current) drug therapy: Secondary | ICD-10-CM | POA: Diagnosis not present

## 2021-03-22 DIAGNOSIS — I480 Paroxysmal atrial fibrillation: Secondary | ICD-10-CM

## 2021-03-22 DIAGNOSIS — D6869 Other thrombophilia: Secondary | ICD-10-CM

## 2021-03-22 DIAGNOSIS — I4819 Other persistent atrial fibrillation: Secondary | ICD-10-CM | POA: Insufficient documentation

## 2021-03-22 DIAGNOSIS — Z7901 Long term (current) use of anticoagulants: Secondary | ICD-10-CM | POA: Insufficient documentation

## 2021-03-22 DIAGNOSIS — I10 Essential (primary) hypertension: Secondary | ICD-10-CM | POA: Diagnosis not present

## 2021-03-22 DIAGNOSIS — Z8249 Family history of ischemic heart disease and other diseases of the circulatory system: Secondary | ICD-10-CM | POA: Insufficient documentation

## 2021-03-22 NOTE — Progress Notes (Signed)
Patient ID: Elizabeth Sherman, female   DOB: 1940/09/18, 80 y.o.   MRN: [id number]      Primary Care Physician: Maximiano Coss, MD Referring Physician:  ER f/u   Elizabeth Sherman is a 80 y.o. female with a h/o new onset afib 01/14/16. This was dx at her PCP, picked up at the exam, pt was unaware. She then decided she would go to the ER for her heart to be evaluated. She was asymptomatic. She was given one eliquis for that evening, rx for cardizem, and was referred here.  She was seen in the afib clinic for the first time, 01/20/16, by then on cardizem and was in afib at 53 bpm,  stated that she feels well and denies any shortness of breath, or fatigue. 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 She does not thinks she snores, no smoking, no alcohol, no large amounts of caffeine.Chadsvasc score of at least 4.  Pt here for f/u, 8/14/`7 and is feeling well. She had one episode of chest pain for which she was evaluated for at [facility] hospital in the ER, 01/23/16, and it had resolved by the time she reached the ER and has not returned. She is still unaware of afib but would like to attempt cardioversion when she has bee on blood thinner x 3 weeks. No issues with the anticoagulant and is taking regularly.  Returns to the afib clinic 02/27/16, f/u cardioversion. This was successful and she remains in SR. She thinks she may see slight improvement in SR but no significant change in her overall energy level.Remains on apixaban.  F/u in a fib clinc 05/28/16. She has been staying in SR. She feels well. Continues on eliquis, no bleeding issues.  F/u in afib clinic 02/22/17 and she is in afib at 92 bpm. She is unaware. She is under extra stress from her daughter being diagnosed with cancer and is coming with her to the Ca center at [facility] 5 days a week, from [place].Marland Kitchen She continues on eliquis.  F/u in the afib clinic, 03/13/17. She continues with afib, rate controlled. She is minimally  symptomatic with this but would like to undergo another cardioversion, however, when to schedule it is up in the air, as her schedule is so full transporting her daughter to radiation. No missed dosed of eliquis.  F/u in afib clinic, 04/18/17, pt had been set up for cardioversion several weeks ago but called and cancelled as she developed an URI. She is now getting over it and is back in the afib clinic to further discuss. I am concerned that now she has been in afib for 2 months and her atrium are severly dilated. I am surprised that she stayed in SR as well as she did since last cardioversion about 2 years ago.  F/u in afib clinic 05/02/17, she has been started on flecainide  50 mg bid, after a low risk stress test. EKG shows no interval change but  remains in afib, rate controlled. She is over her URI and is feeling better from that standpoint. No missed doses of eliquis.  F/u in afib clinic, after successful cardioversion 9/01,but she feels dizzy despite being in SR.Marland Kitchen She called office last week with these c/o and flecainide was reduced to 50 mg bid. Ekg shows SR at 85 bpm, intervals OK. Will try to reduce to 25 mg bid and see if symptoms improve. She refuses a flecainide level today.  F/u  in fib clinic, 08/12/17. She is staying in SR. She feels well. She called in after cardioversion c/o dizziness and flecainide was reduced to 25 mg bid. This has kept her in SR and eliminated her c/o dizziness.  F/u in afib clinic 02/03/18. She is doing well staying in SR on flecainide. Voices no complaints today.  F/u in afib clinic, 12/09/18 and is in SR. She has not noted any afib and is staying in rhythm on flecainide 50 mg bid. No issues with DOAC. She was having some dizziness and her PCP reduced her losartan to 25 mg daily and this resolved the dizziness which reasonable BP control.  F/u in afib clinic, 03/27/19. She feels well. No awareness of afib. Continues on flecainide. No bleeding issues with  eliquis.  F/u 09/29/19. She states no awareness of afib. Continues on flecainide. No issues with bleeding on eliquis with a CHA2DS2VASc score of 4.  F/u in afib clinic, 04/01/20. She is staying in SR on flecainide. No awareness of afib. No issues with bleeding, remains on 5 mg eliquis bid.  CHA2DS2VASc score is  at least 4.  F/u in afib clinic, 08/31/20. She  reports feeling very well. No symptoms of afib. She continues on eliquis with a CHA2DS2VASc score of 4.  F/u in afib clinic, 03/22/21. She continues on flecainide with a CHA2DS2VASc score of 4. No issues with bleeding. No afib to report.       Today, she denies symptoms of palpitations, chest pain, shortness of breath, orthopnea, PND, lower extremity edema, dizziness, presyncope, syncope, or neurologic sequela. The patient is tolerating medications without difficulties and is otherwise without complaint today.   Past Medical History:  Diagnosis Date   A-fib (HCC)    Depression    Hyperlipidemia    Hypertension    Past Surgical History:  Procedure Laterality Date   ABDOMINAL HYSTERECTOMY     APPENDECTOMY     CARDIOVERSION N/A 02/17/2016   Procedure: CARDIOVERSION;  Surgeon: Chrystie Nose, MD;  Location: [facility] ENDOSCOPY;  Service: Cardiovascular;  Laterality: N/A;   CARDIOVERSION N/A 05/14/2017   Procedure: CARDIOVERSION;  Surgeon: Laurey Morale, MD;  Location: [facility] ENDOSCOPY;  Service: Cardiovascular;  Laterality: N/A;    Current Outpatient Medications  Medication Sig Dispense Refill   apixaban (ELIQUIS) 5 MG TABS tablet Take 1 tablet (5 mg total) by mouth 2 (two) times daily. 42 tablet 0   busPIRone (BUSPAR) 5 MG tablet Take 1 tablet (5 mg total) by mouth 3 (three) times daily as needed. 15 tablet 1   Cholecalciferol (VITAMIN D3) 2000 units TABS Take 2,000 Units 2 (two) times a week by mouth.     diltiazem (CARTIA XT) 120 MG 24 hr capsule Take 1 capsule by mouth once daily 90 capsule 2   flecainide (TAMBOCOR) 50 MG tablet Take 1  tablet by mouth twice daily 60 tablet 6   traMADol (ULTRAM) 50 MG tablet Take 50 mg by mouth as needed.      No current facility-administered medications for this encounter.    No Known Allergies  Social History   Socioeconomic History   Marital status: Single    Spouse name: Not on file   Number of children: Not on file   Years of education: Not on file   Highest education level: Not on file  Occupational History   Not on file  Tobacco Use   Smoking status: Never   Smokeless tobacco: Never  Substance and Sexual Activity   Alcohol use:  No   Drug use: No   Sexual activity: Not on file  Other Topics Concern   Not on file  Social History Narrative   Not on file   Social Determinants of Health   Financial Resource Strain: Not on file  Food Insecurity: Not on file  Transportation Needs: Not on file  Physical Activity: Not on file  Stress: Not on file  Social Connections: Not on file  Intimate Partner Violence: Not on file    Family History  Problem Relation Age of Onset   Heart failure Mother     ROS- All systems are reviewed and negative except as per the HPI above  Physical Exam: Vitals:   03/22/21 1318  BP: (!) 184/66  Pulse: 61  Weight: 88.6 kg  Height: 5\' 6"  (1.676 m)    GEN- The patient is well appearing, alert and oriented x 3 today.   Head- normocephalic, atraumatic Eyes-  Sclera clear, conjunctiva pink Ears- hearing intact Oropharynx- clear Neck- supple, no JVP Lymph- no cervical lymphadenopathy Lungs- Clear to ausculation bilaterally, normal work of breathing Heart- regular rate and rhythm, no murmurs, rubs or gallops, PMI not laterally displaced GI- soft, NT, ND, + BS Extremities- no clubbing, cyanosis, or edema MS- no significant deformity or atrophy Skin- no rash or lesion Psych- euthymic mood, full affect Neuro- strength and sensation are intact  EKG SR at 61 bpm, with first degree AVB at 61 bpm, pr int 222 ms, qrs int 98 ms, qtc 368  ms   Echo--8/17- Left ventricle: The cavity size was normal. Wall thickness was   increased in a pattern of mild LVH. Systolic function was normal.   The estimated ejection fraction was in the range of 60% to 65%.   Indeterminant diastolic function (atrial fibrillation). Wall   motion was normal; there were no regional wall motion   abnormalities. - Aortic valve: There was no stenosis. - Mitral valve: There was trivial regurgitation. - Left atrium: The atrium was severely dilated, 41 mm, diameter, LA volume 103 ml - Right ventricle: The cavity size was normal. Systolic function   was normal. - Right atrium: The atrium was severely dilated. - Pulmonary arteries: No complete TR doppler jet so unable to   estimate PA systolic pressure. - Inferior vena cava: The vessel was normal in size. The   respirophasic diameter changes were in the normal range (>= 50%),   consistent with normal central venous pressure. - Pericardium, extracardiac: Prominent epicardial adipose tissue.   Impressions:   - The patient was in atrial fibrillation. Normal LV size with mild   LV hypertrophy. EF 60-65%. Normal RV size and systolic function.   No significant valvular abnormalities. Severe biatrial   enlargement.  Stress test-Nuclear stress EF: 74%. There was no ST segment deviation noted during stress. The study is normal. The left ventricular ejection fraction is hyperdynamic (>65%).   1. EF 74%, normal wall motion.  2. No evidence for ischemia or infarction on perfusion images.    Normal study.   Assessment and Plan:  1. Persistent  afib Had been maintaining SR on flecainide 50 mg bid Continue cardizem 120 mg qd Continue eliquis 5 mg bid,  chadsvasc score is at least 4 Bmet/cbc drawn pending next week with PCP and have asked to have faxed to me  2. HTN Elevated today but stable at home Drove in from [place], [place]  On recheck 158/78  F/u in 6 months    [place]. Elvina Sidle, ANP-C  Afib  Clinic [facility] [address] [place], [place] [address] [phone]

## 2021-03-22 NOTE — Addendum Note (Signed)
Encounter addended by: Newman Nip, NP on: 03/22/2021 2:44 PM  Actions taken: Clinical Note Signed

## 2021-03-24 ENCOUNTER — Other Ambulatory Visit (HOSPITAL_COMMUNITY): Payer: Self-pay

## 2021-03-24 MED ORDER — APIXABAN 5 MG PO TABS: 5.0000 mg | ORAL_TABLET | Freq: Two times a day (BID) | ORAL | 0 refills | Status: DC

## 2021-03-28 ENCOUNTER — Other Ambulatory Visit (HOSPITAL_COMMUNITY): Payer: Self-pay | Admitting: *Deleted

## 2021-03-28 DIAGNOSIS — I4819 Other persistent atrial fibrillation: Secondary | ICD-10-CM

## 2021-05-04 ENCOUNTER — Other Ambulatory Visit (HOSPITAL_COMMUNITY): Payer: Self-pay | Admitting: Nurse Practitioner

## 2021-05-10 ENCOUNTER — Other Ambulatory Visit (HOSPITAL_COMMUNITY): Payer: Self-pay

## 2021-05-10 MED ORDER — APIXABAN 5 MG PO TABS: 5.0000 mg | ORAL_TABLET | Freq: Two times a day (BID) | ORAL | 0 refills | Status: DC

## 2021-07-25 ENCOUNTER — Other Ambulatory Visit (HOSPITAL_COMMUNITY): Payer: Self-pay | Admitting: Nurse Practitioner

## 2021-09-12 ENCOUNTER — Other Ambulatory Visit: Payer: Self-pay

## 2021-09-12 ENCOUNTER — Encounter (HOSPITAL_COMMUNITY): Payer: Self-pay | Admitting: Nurse Practitioner

## 2021-09-12 ENCOUNTER — Ambulatory Visit (HOSPITAL_COMMUNITY)
Admission: RE | Admit: 2021-09-12 | Discharge: 2021-09-12 | Disposition: A | Discharge status: Home / Self Care | Payer: Medicare Other | Source: Ambulatory Visit | Attending: Nurse Practitioner | Admitting: Nurse Practitioner

## 2021-09-12 VITALS — BP 160/84 | HR 98 | Ht 66.0 in | Wt 192.4 lb

## 2021-09-12 DIAGNOSIS — I4819 Other persistent atrial fibrillation: Secondary | ICD-10-CM | POA: Insufficient documentation

## 2021-09-12 DIAGNOSIS — D6869 Other thrombophilia: Secondary | ICD-10-CM

## 2021-09-12 DIAGNOSIS — Z7901 Long term (current) use of anticoagulants: Secondary | ICD-10-CM | POA: Insufficient documentation

## 2021-09-12 DIAGNOSIS — Z79899 Other long term (current) drug therapy: Secondary | ICD-10-CM | POA: Diagnosis not present

## 2021-09-12 DIAGNOSIS — I119 Hypertensive heart disease without heart failure: Secondary | ICD-10-CM | POA: Diagnosis not present

## 2021-09-12 MED ORDER — DILTIAZEM HCL ER COATED BEADS 120 MG PO CP24: 120.0000 mg | ORAL_CAPSULE | Freq: Two times a day (BID) | ORAL | 2 refills | Status: DC

## 2021-09-12 NOTE — Progress Notes (Addendum)
Patient ID: Elizabeth Sherman, female   DOB: 11-28-1940, 81 y.o.   MRN: [id number] ? ? ? ? ? ?Primary Care Physician: Yvone Neu, MD ?Referring Physician:  ER f/u ? ? ?Elizabeth Sherman is a 81 y.o. female with a h/o new onset afib 01/14/16. This was dx at her PCP, picked up at the exam, pt was unaware. She then decided she would go to the ER for her heart to be evaluated. She was asymptomatic. She was given one eliquis for that evening, rx for cardizem, and was referred here. ? ?She was seen in the afib clinic for the first time, 01/20/16, by then on cardizem and was in afib at 53 bpm,  stated that she feels well and denies any shortness of breath, or fatigue. 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 She does not thinks she snores, no smoking, no alcohol, no large amounts of caffeine.Chadsvasc score of at least 4. ? ?Pt here for f/u, 8/14/`7 and is feeling well. She had one episode of chest pain for which she was evaluated for at [facility] hospital in the ER, 01/23/16, and it had resolved by the time she reached the ER and has not returned. She is still unaware of afib but would like to attempt cardioversion when she has bee on blood thinner x 3 weeks. No issues with the anticoagulant and is taking regularly. ? ?Returns to the afib clinic 02/27/16, f/u cardioversion. This was successful and she remains in SR. She thinks she may see slight improvement in SR but no significant change in her overall energy level.Remains on apixaban. ? ?F/u in a fib clinc 05/28/16. She has been staying in [place]. She feels well. Continues on eliquis, no bleeding issues. ? ?F/u in afib clinic 02/22/17 and she is in afib at 92 bpm. She is unaware. She is under extra stress from her daughter being diagnosed with cancer and is coming with her to the Ca center at [place] 5 days a week, from [place].Marland Kitchen She continues on eliquis. ? ?F/u in the afib clinic, 03/13/17. She continues with afib, rate controlled. She is minimally  symptomatic with this but would like to undergo another cardioversion, however, when to schedule it is up in the air, as her schedule is so full transporting her daughter to radiation. No missed dosed of eliquis. ? ?F/u in afib clinic, 04/18/17, pt had been set up for cardioversion several weeks ago but called and cancelled as she developed an URI. She is now getting over it and is back in the afib clinic to further discuss. I am concerned that now she has been in afib for 2 months and her atrium are severly dilated. I am surprised that she stayed in SR as well as she did since last cardioversion about 2 years ago. ? ?F/u in afib clinic 05/02/17, she has been started on flecainide  50 mg bid, after a low risk stress test. EKG shows no interval change but  remains in afib, rate controlled. She is over her URI and is feeling better from that standpoint. No missed doses of eliquis. ? ?F/u in afib clinic, after successful cardioversion 9/01,but she feels dizzy despite being in SR.Marland Kitchen She called office last week with these c/o and flecainide was reduced to 50 mg bid. Ekg shows SR at 85 bpm, intervals OK. Will try to reduce to 25 mg bid and see if symptoms improve. She refuses a flecainide level today. ? ?F/u  in fib clinic, 08/12/17. She is staying in [place]. She feels well. She called in after cardioversion c/o dizziness and flecainide was reduced to 25 mg bid. This has kept her in SR and eliminated her c/o dizziness. ? ?F/u in afib clinic 02/03/18. She is doing well staying in SR on flecainide. Voices no complaints today. ? ?F/u in afib clinic, 12/09/18 and is in [place]. She has not noted any afib and is staying in rhythm on flecainide 50 mg bid. No issues with DOAC. She was having some dizziness and her PCP reduced her losartan to 25 mg daily and this resolved the dizziness which reasonable BP control. ? ?F/u in afib clinic, 03/27/19. She feels well. No awareness of afib. Continues on flecainide. No bleeding issues with  eliquis. ? ?F/u 09/29/19. She states no awareness of afib. Continues on flecainide. No issues with bleeding on eliquis with a CHA2DS2VASc score of 4. ? ?F/u in afib clinic, 04/01/20. She is staying in SR on flecainide. No awareness of afib. No issues with bleeding, remains on 5 mg eliquis bid.  CHA2DS2VASc score is  at least 4. ? ?F/u in afib clinic, 08/31/20. She  reports feeling very well. No symptoms of afib. She continues on eliquis with a CHA2DS2VASc score of 4. ? ?F/u in afib clinic, 03/22/21. She continues on flecainide with a CHA2DS2VASc score of 4. No issues with bleeding. No afib to report.  ? ?F/u in afib office, 09/12/21,  with afib noted last week at time of eye surgery. Pt is asymptomatic. She would prefer not to have a CV if possible. Discussed increasing rate control first and she is in agreement.  ?  ? Today, she denies symptoms of palpitations, chest pain, shortness of breath, orthopnea, PND, lower extremity edema, dizziness, presyncope, syncope, or neurologic sequela. The patient is tolerating medications without difficulties and is otherwise without complaint today.  ? ?Past Medical History:  ?Diagnosis Date  ? A-fib ([place])   ? Depression   ? Hyperlipidemia   ? Hypertension   ? ?Past Surgical History:  ?Procedure Laterality Date  ? ABDOMINAL HYSTERECTOMY    ? APPENDECTOMY    ? CARDIOVERSION N/A 02/17/2016  ? Procedure: CARDIOVERSION;  Surgeon: Pixie Casino, MD;  Location: [facility];  Service: Cardiovascular;  Laterality: N/A;  ? CARDIOVERSION N/A 05/14/2017  ? Procedure: CARDIOVERSION;  Surgeon: Larey Dresser, MD;  Location[facility] ENDOSCOPY;  Service: Cardiovascular;  Laterality: N/A;  ? ? ?Current Outpatient Medications  ?Medication Sig Dispense Refill  ? apixaban (ELIQUIS) 5 MG TABS tablet Take 1 tablet (5 mg total) by mouth 2 (two) times daily. 56 tablet 0  ? busPIRone (BUSPAR) 5 MG tablet Take 1 tablet (5 mg total) by mouth 3 (three) times daily as needed. 15 tablet 1  ? Cholecalciferol  (VITAMIN D3) 2000 units TABS Take 2,000 Units 2 (two) times a week by mouth.    ? flecainide (TAMBOCOR) 50 MG tablet Take 1 tablet (50 mg total) by mouth 2 (two) times daily. Appointment Required For Further Refills [phone] 60 tablet 4  ? traMADol (ULTRAM) 50 MG tablet Take 50 mg by mouth as needed.     ? diltiazem (CARDIZEM CD) 120 MG 24 hr capsule Take 1 capsule (120 mg total) by mouth 2 (two) times daily. 60 capsule 2  ? FEROSUL 325 (65 Fe) MG tablet Take 325 mg by mouth daily. (Patient not taking: Reported on 09/12/2021)    ? ?No current facility-administered medications for this encounter.  ? ? ?  No Known Allergies ? ?Social History  ? ?Socioeconomic History  ? Marital status: Single  ?  Spouse name: Not on file  ? Number of children: Not on file  ? Years of education: Not on file  ? Highest education level: Not on file  ?Occupational History  ? Not on file  ?Tobacco Use  ? Smoking status: Never  ? Smokeless tobacco: Never  ?Substance and Sexual Activity  ? Alcohol use: No  ? Drug use: No  ? Sexual activity: Not on file  ?Other Topics Concern  ? Not on file  ?Social History Narrative  ? Not on file  ? ?Social Determinants of Health  ? ?Financial Resource Strain: Not on file  ?Food Insecurity: Not on file  ?Transportation Needs: Not on file  ?Physical Activity: Not on file  ?Stress: Not on file  ?Social Connections: Not on file  ?Intimate Partner Violence: Not on file  ? ? ?Family History  ?Problem Relation Age of Onset  ? Heart failure Mother   ? ? ?ROS- All systems are reviewed and negative except as per the HPI above ? ?Physical Exam: ?Vitals:  ? 09/12/21 1427  ?BP: (!) 160/84  ?Pulse: 98  ?Weight: 87.3 kg  ?Height: 5\' 6"  (1.676 m)  ? ? ?GEN- The patient is well appearing, alert and oriented x 3 today.   ?Head- normocephalic, atraumatic ?Eyes-  Sclera clear, conjunctiva pink ?Ears- hearing intact ?Oropharynx- clear ?Neck- supple, no JVP ?Lymph- no cervical lymphadenopathy ?Lungs- Clear to ausculation  bilaterally, normal work of breathing ?Heart- irregular rate and rhythm, no murmurs, rubs or gallops, PMI not laterally displaced ?GI- soft, NT, ND, + BS ?Extremities- no clubbing, cyanosis, or edema ?MS- no significant defo

## 2021-09-12 NOTE — Patient Instructions (Signed)
Increase cardizem to 120mg twice a day ?

## 2021-09-13 ENCOUNTER — Other Ambulatory Visit (HOSPITAL_COMMUNITY): Payer: Self-pay | Admitting: Nurse Practitioner

## 2021-09-27 ENCOUNTER — Ambulatory Visit (HOSPITAL_COMMUNITY)
Admission: RE | Admit: 2021-09-27 | Discharge: 2021-09-27 | Disposition: A | Discharge status: Home / Self Care | Payer: Medicare Other | Source: Ambulatory Visit | Attending: Nurse Practitioner | Admitting: Nurse Practitioner

## 2021-09-27 ENCOUNTER — Encounter (HOSPITAL_COMMUNITY): Payer: Self-pay | Admitting: Nurse Practitioner

## 2021-09-27 VITALS — BP 150/70 | HR 87 | Ht 66.0 in | Wt 193.4 lb

## 2021-09-27 DIAGNOSIS — I119 Hypertensive heart disease without heart failure: Secondary | ICD-10-CM | POA: Insufficient documentation

## 2021-09-27 DIAGNOSIS — Z7901 Long term (current) use of anticoagulants: Secondary | ICD-10-CM | POA: Diagnosis not present

## 2021-09-27 DIAGNOSIS — D6869 Other thrombophilia: Secondary | ICD-10-CM

## 2021-09-27 DIAGNOSIS — Z79899 Other long term (current) drug therapy: Secondary | ICD-10-CM | POA: Diagnosis not present

## 2021-09-27 DIAGNOSIS — I4819 Other persistent atrial fibrillation: Secondary | ICD-10-CM | POA: Insufficient documentation

## 2021-09-27 DIAGNOSIS — I484 Atypical atrial flutter: Secondary | ICD-10-CM

## 2021-09-27 DIAGNOSIS — I4892 Unspecified atrial flutter: Secondary | ICD-10-CM | POA: Diagnosis not present

## 2021-09-27 NOTE — Progress Notes (Signed)
Patient ID: Elizabeth Sherman, female   DOB: 03/06/1941, 81 y.o.   MRN: [id number] ? ? ? ? ? ?Primary Care Physician: Yvone Neu, MD ?Referring Physician:  ER f/u ? ? ?Elizabeth Sherman is a 81 y.o. female with a h/o new onset afib 01/14/16. This was dx at her PCP, picked up at the exam, pt was unaware. She then decided she would go to the ER for her heart to be evaluated. She was asymptomatic. She was given one eliquis for that evening, rx for cardizem, and was referred here. ? ?She was seen in the afib clinic for the first time, 01/20/16, by then on cardizem and was in afib at 53 bpm,  stated that she feels well and denies any shortness of breath, or fatigue. 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 She does not thinks she snores, no smoking, no alcohol, no large amounts of caffeine.Chadsvasc score of at least 4. ? ?Pt here for f/u, 8/14/`7 and is feeling well. She had one episode of chest pain for which she was evaluated for at [facility] hospital in the ER, 01/23/16, and it had resolved by the time she reached the ER and has not returned. She is still unaware of afib but would like to attempt cardioversion when she has bee on blood thinner x 3 weeks. No issues with the anticoagulant and is taking regularly. ? ?Returns to the afib clinic 02/27/16, f/u cardioversion. This was successful and she remains in SR. She thinks she may see slight improvement in SR but no significant change in her overall energy level.Remains on apixaban. ? ?F/u in a fib clinc 05/28/16. She has been staying in [place]. She feels well. Continues on eliquis, no bleeding issues. ? ?F/u in afib clinic 02/22/17 and she is in afib at 92 bpm. She is unaware. She is under extra stress from her daughter being diagnosed with cancer and is coming with her to the Ca center at [place] 5 days a week, from [place].Marland Kitchen She continues on eliquis. ? ?F/u in the afib clinic, 03/13/17. She continues with afib, rate controlled. She is minimally  symptomatic with this but would like to undergo another cardioversion, however, when to schedule it is up in the air, as her schedule is so full transporting her daughter to radiation. No missed dosed of eliquis. ? ?F/u in afib clinic, 04/18/17, pt had been set up for cardioversion several weeks ago but called and cancelled as she developed an URI. She is now getting over it and is back in the afib clinic to further discuss. I am concerned that now she has been in afib for 2 months and her atrium are severly dilated. I am surprised that she stayed in SR as well as she did since last cardioversion about 2 years ago. ? ?F/u in afib clinic 05/02/17, she has been started on flecainide  50 mg bid, after a low risk stress test. EKG shows no interval change but  remains in afib, rate controlled. She is over her URI and is feeling better from that standpoint. No missed doses of eliquis. ? ?F/u in afib clinic, after successful cardioversion 9/01,but she feels dizzy despite being in SR.Marland Kitchen She called office last week with these c/o and flecainide was reduced to 50 mg bid. Ekg shows SR at 85 bpm, intervals OK. Will try to reduce to 25 mg bid and see if symptoms improve. She refuses a flecainide level today. ? ?F/u  in fib clinic, 08/12/17. She is staying in [place]. She feels well. She called in after cardioversion c/o dizziness and flecainide was reduced to 25 mg bid. This has kept her in SR and eliminated her c/o dizziness. ? ?F/u in afib clinic 02/03/18. She is doing well staying in SR on flecainide. Voices no complaints today. ? ?F/u in afib clinic, 12/09/18 and is in [place]. She has not noted any afib and is staying in rhythm on flecainide 50 mg bid. No issues with DOAC. She was having some dizziness and her PCP reduced her losartan to 25 mg daily and this resolved the dizziness which reasonable BP control. ? ?F/u in afib clinic, 03/27/19. She feels well. No awareness of afib. Continues on flecainide. No bleeding issues with  eliquis. ? ?F/u 09/29/19. She states no awareness of afib. Continues on flecainide. No issues with bleeding on eliquis with a CHA2DS2VASc score of 4. ? ?F/u in afib clinic, 04/01/20. She is staying in SR on flecainide. No awareness of afib. No issues with bleeding, remains on 5 mg eliquis bid.  CHA2DS2VASc score is  at least 4. ? ?F/u in afib clinic, 08/31/20. She  reports feeling very well. No symptoms of afib. She continues on eliquis with a CHA2DS2VASc score of 4. ? ?F/u in afib clinic, 03/22/21. She continues on flecainide with a CHA2DS2VASc score of 4. No issues with bleeding. No afib to report.  ? ?F/u in afib office, 09/12/21,  with afib noted last week at time of eye surgery. Pt is asymptomatic. She would prefer not to have a CV if possible. Discussed increasing rate control first and she is in agreement.  ? ?F/u in afib clinic 09/27/21 and she remains in rate controlled afib. No missed anticoagulation. Is compliant with flecainide. She states thjat the afib is not bothering her and she really does not want another cardioversion.  ?  ? Today, she denies symptoms of palpitations, chest pain, shortness of breath, orthopnea, PND, lower extremity edema, dizziness, presyncope, syncope, or neurologic sequela. The patient is tolerating medications without difficulties and is otherwise without complaint today.  ? ?Past Medical History:  ?Diagnosis Date  ? A-fib ([place])   ? Depression   ? Hyperlipidemia   ? Hypertension   ? ?Past Surgical History:  ?Procedure Laterality Date  ? ABDOMINAL HYSTERECTOMY    ? APPENDECTOMY    ? CARDIOVERSION N/A 02/17/2016  ? Procedure: CARDIOVERSION;  Surgeon: Pixie Casino, MD;  Location: [facility];  Service: Cardiovascular;  Laterality: N/A;  ? CARDIOVERSION N/A 05/14/2017  ? Procedure: CARDIOVERSION;  Surgeon: Larey Dresser, MD;  Location[facility] ENDOSCOPY;  Service: Cardiovascular;  Laterality: N/A;  ? ? ?Current Outpatient Medications  ?Medication Sig Dispense Refill  ? ADVAIR DISKUS  100-50 MCG/ACT AEPB 1 puff 2 (two) times daily as needed.    ? busPIRone (BUSPAR) 5 MG tablet Take 1 tablet (5 mg total) by mouth 3 (three) times daily as needed. 15 tablet 1  ? Cholecalciferol (VITAMIN D3) 2000 units TABS Take 2,000 Units 2 (two) times a week by mouth.    ? diltiazem (CARDIZEM CD) 120 MG 24 hr capsule Take 1 capsule (120 mg total) by mouth 2 (two) times daily. 60 capsule 2  ? ELIQUIS 5 MG TABS tablet Take 1 tablet by mouth twice daily 60 tablet 11  ? FEROSUL 325 (65 Fe) MG tablet Take 325 mg by mouth daily. (Patient not taking: Reported on 09/12/2021)    ? flecainide (TAMBOCOR) 50 MG tablet Take  1 tablet (50 mg total) by mouth 2 (two) times daily. Appointment Required For Further Refills [phone] 60 tablet 4  ? traMADol (ULTRAM) 50 MG tablet Take 50 mg by mouth as needed.     ? ?No current facility-administered medications for this encounter.  ? ? ?No Known Allergies ? ?Social History  ? ?Socioeconomic History  ? Marital status: Single  ?  Spouse name: Not on file  ? Number of children: Not on file  ? Years of education: Not on file  ? Highest education level: Not on file  ?Occupational History  ? Not on file  ?Tobacco Use  ? Smoking status: Never  ? Smokeless tobacco: Never  ?Substance and Sexual Activity  ? Alcohol use: No  ? Drug use: No  ? Sexual activity: Not on file  ?Other Topics Concern  ? Not on file  ?Social History Narrative  ? Not on file  ? ?Social Determinants of Health  ? ?Financial Resource Strain: Not on file  ?Food Insecurity: Not on file  ?Transportation Needs: Not on file  ?Physical Activity: Not on file  ?Stress: Not on file  ?Social Connections: Not on file  ?Intimate Partner Violence: Not on file  ? ? ?Family History  ?Problem Relation Age of Onset  ? Heart failure Mother   ? ? ?ROS- All systems are reviewed and negative except as per the HPI above ? ?Physical Exam: ?Vitals:  ? 09/27/21 1125  ?Weight: 87.7 kg  ?Height: 5\' 6"  (1.676 m)  ? ? ?GEN- The patient is well  appearing, alert and oriented x 3 today.   ?Head- normocephalic, atraumatic ?Eyes-  Sclera clear, conjunctiva pink ?Ears- hearing intact ?Oropharynx- clear ?Neck- supple, no JVP ?Lymph- no cervical lymphadenopathy ?Lungs- C

## 2021-09-27 NOTE — Patient Instructions (Signed)
Stop flecainide 

## 2021-09-29 ENCOUNTER — Other Ambulatory Visit (HOSPITAL_COMMUNITY): Payer: Self-pay

## 2021-09-29 MED ORDER — APIXABAN 5 MG PO TABS: 5.0000 mg | ORAL_TABLET | Freq: Two times a day (BID) | ORAL | 0 refills | Status: DC

## 2021-09-29 MED ORDER — APIXABAN 5 MG PO TABS
5.0000 mg | ORAL_TABLET | Freq: Two times a day (BID) | ORAL | 0 refills | Status: DC
Start: 2021-09-29 — End: 2021-10-27

## 2021-10-10 ENCOUNTER — Encounter (HOSPITAL_COMMUNITY): Payer: Self-pay | Admitting: Nurse Practitioner

## 2021-10-10 ENCOUNTER — Ambulatory Visit (HOSPITAL_COMMUNITY)
Admission: RE | Admit: 2021-10-10 | Discharge: 2021-10-10 | Disposition: A | Discharge status: Home / Self Care | Payer: Medicare Other | Source: Ambulatory Visit | Attending: Nurse Practitioner | Admitting: Nurse Practitioner

## 2021-10-10 VITALS — BP 146/72 | HR 81 | Ht 66.0 in | Wt 193.4 lb

## 2021-10-10 DIAGNOSIS — Z79899 Other long term (current) drug therapy: Secondary | ICD-10-CM | POA: Insufficient documentation

## 2021-10-10 DIAGNOSIS — I4892 Unspecified atrial flutter: Secondary | ICD-10-CM | POA: Insufficient documentation

## 2021-10-10 DIAGNOSIS — Z7901 Long term (current) use of anticoagulants: Secondary | ICD-10-CM | POA: Insufficient documentation

## 2021-10-10 DIAGNOSIS — D6869 Other thrombophilia: Secondary | ICD-10-CM

## 2021-10-10 DIAGNOSIS — I119 Hypertensive heart disease without heart failure: Secondary | ICD-10-CM | POA: Diagnosis not present

## 2021-10-10 DIAGNOSIS — I484 Atypical atrial flutter: Secondary | ICD-10-CM

## 2021-10-10 DIAGNOSIS — I4819 Other persistent atrial fibrillation: Secondary | ICD-10-CM | POA: Insufficient documentation

## 2021-10-10 LAB — BASIC METABOLIC PANEL
Anion gap: 8 (ref 5–15)
BUN: 19 mg/dL (ref 8–23)
CO2: 21 mmol/L — ABNORMAL LOW (ref 22–32)
Calcium: 8.5 mg/dL — ABNORMAL LOW (ref 8.9–10.3)
Chloride: 111 mmol/L (ref 98–111)
Creatinine, Ser: 1.27 mg/dL — ABNORMAL HIGH (ref 0.44–1.00)
GFR, Estimated: 42 mL/min — ABNORMAL LOW (ref >60)
Glucose, Bld: 108 mg/dL — ABNORMAL HIGH (ref 70–99)
Potassium: 3.8 mmol/L (ref 3.5–5.1)
Sodium: 140 mmol/L (ref 135–145)

## 2021-10-10 LAB — CBC
HCT: 37.2 % (ref 36.0–46.0)
Hemoglobin: 10.6 g/dL — ABNORMAL LOW (ref 12.0–15.0)
MCH: 22 pg — ABNORMAL LOW (ref 26.0–34.0)
MCHC: 28.5 g/dL — ABNORMAL LOW (ref 30.0–36.0)
MCV: 77.2 fL — ABNORMAL LOW (ref 80.0–100.0)
Platelets: 305 10*3/uL (ref 150–400)
RBC: 4.82 MIL/uL (ref 3.87–5.11)
RDW: 18 % — ABNORMAL HIGH (ref 11.5–15.5)
WBC: 8 10*3/uL (ref 4.0–10.5)
nRBC: 0 % (ref 0.0–0.2)

## 2021-10-10 NOTE — Patient Instructions (Signed)
Cardioversion scheduled for Friday, May 5th ? - Arrive at the [facility] and go to admitting at 10am ? - Do not eat or drink anything after midnight the night prior to your procedure. ? - Take all your morning medication (except diabetic medications) with a sip of water prior to arrival. ? - You will not be able to drive home after your procedure. ? - Do NOT miss any doses of your blood thinner - if you should miss a dose please notify our office immediately. ? - If you feel as if you go back into normal rhythm prior to scheduled cardioversion, please notify our office immediately. If your procedure is canceled in the cardioversion suite you will be charged a cancellation fee. ? ?

## 2021-10-10 NOTE — H&P (View-Only) (Signed)
Patient ID: Elizabeth Sherman, female   DOB: 12/12/1940, 81 y.o.   MRN: [id number] ? ? ? ? ? ?Primary Care Physician: Yvone Neu, MD ?Referring Physician:  ER f/u ? ? ?Elizabeth Sherman is a 81 y.o. female with a h/o new onset afib 01/14/16. This was dx at her PCP, picked up at the exam, pt was unaware. She then decided she would go to the ER for her heart to be evaluated. She was asymptomatic. She was given one eliquis for that evening, rx for cardizem, and was referred here. ? ?She was seen in the afib clinic for the first time, 01/20/16, by then on cardizem and was in afib at 53 bpm,  stated that she feels well and denies any shortness of breath, or fatigue. 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 She does not thinks she snores, no smoking, no alcohol, no large amounts of caffeine.Chadsvasc score of at least 4. ? ?Pt here for f/u, 8/14/`7 and is feeling well. She had one episode of chest pain for which she was evaluated for at [facility] [place] [facility] in the ER, 01/23/16, and it had resolved by the time she reached the ER and has not returned. She is still unaware of afib but would like to attempt cardioversion when she has bee on blood thinner x 3 weeks. No issues with the anticoagulant and is taking regularly. ? ?Returns to the afib clinic 02/27/16, f/u cardioversion. This was successful and she remains in SR. She thinks she may see slight improvement in SR but no significant change in her overall energy level.Remains on apixaban. ? ?F/u in a fib clinc 05/28/16. She has been staying in [place]. She feels well. Continues on eliquis, no bleeding issues. ? ?F/u in afib clinic 02/22/17 and she is in afib at 92 bpm. She is unaware. She is under extra stress from her daughter being diagnosed with cancer and is coming with her to the Ca center at [place] 5 days a week, from [place].Marland Kitchen She continues on eliquis. ? ?F/u in the afib clinic, 03/13/17. She continues with afib, rate controlled. She is minimally  symptomatic with this but would like to undergo another cardioversion, however, when to schedule it is up in the air, as her schedule is so full transporting her daughter to radiation. No missed dosed of eliquis. ? ?F/u in afib clinic, 04/18/17, pt had been set up for cardioversion several weeks ago but called and cancelled as she developed an URI. She is now getting over it and is back in the afib clinic to further discuss. I am concerned that now she has been in afib for 2 months and her atrium are severly dilated. I am surprised that she stayed in SR as well as she did since last cardioversion about 2 years ago. ? ?F/u in afib clinic 05/02/17, she has been started on flecainide  50 mg bid, after a low risk stress test. EKG shows no interval change but  remains in afib, rate controlled. She is over her URI and is feeling better from that standpoint. No missed doses of eliquis. ? ?F/u in afib clinic, after successful cardioversion 9/01,but she feels dizzy despite being in SR.Marland Kitchen She called office last week with these c/o and flecainide was reduced to 50 mg bid. Ekg shows SR at 85 bpm, intervals OK. Will try to reduce to 25 mg bid and see if symptoms improve. She refuses a flecainide level today. ? ?F/u  in fib clinic, 08/12/17. She is staying in [place]. She feels well. She called in after cardioversion c/o dizziness and flecainide was reduced to 25 mg bid. This has kept her in SR and eliminated her c/o dizziness. ? ?F/u in afib clinic 02/03/18. She is doing well staying in SR on flecainide. Voices no complaints today. ? ?F/u in afib clinic, 12/09/18 and is in [place]. She has not noted any afib and is staying in rhythm on flecainide 50 mg bid. No issues with DOAC. She was having some dizziness and her PCP reduced her losartan to 25 mg daily and this resolved the dizziness which reasonable BP control. ? ?F/u in afib clinic, 03/27/19. She feels well. No awareness of afib. Continues on flecainide. No bleeding issues with  eliquis. ? ?F/u 09/29/19. She states no awareness of afib. Continues on flecainide. No issues with bleeding on eliquis with a CHA2DS2VASc score of 4. ? ?F/u in afib clinic, 04/01/20. She is staying in SR on flecainide. No awareness of afib. No issues with bleeding, remains on 5 mg eliquis bid.  CHA2DS2VASc score is  at least 4. ? ?F/u in afib clinic, 08/31/20. She  reports feeling very well. No symptoms of afib. She continues on eliquis with a CHA2DS2VASc score of 4. ? ?F/u in afib clinic, 03/22/21. She continues on flecainide with a CHA2DS2VASc score of 4. No issues with bleeding. No afib to report.  ? ?F/u in afib office, 09/12/21,  with afib noted last week at time of eye surgery. Pt is asymptomatic. She would prefer not to have a CV if possible. Discussed increasing rate control first and she is in agreement.  ? ?F/u in afib clinic 09/27/21 and she remains in rate controlled afib. No missed anticoagulation. Is compliant with flecainide. She states thjat the afib is not bothering her and she really does not want another cardioversion.  ? ?F/u in afib clinic, 10/10/21, as pt called back to clinic several days after last being seen and decided that she did want a cardioversion. She has recently noted more shortness of breath. She is not retaining fluid , as her weight is down 4 lbs. I stopped flecainide on last visit as pt felt she wanted a rate control strategy but she has been back on flecainide 50 mg bid x one week.  ?  ? Today, she denies symptoms of palpitations, chest pain, shortness of breath, orthopnea, PND, lower extremity edema, dizziness, presyncope, syncope, or neurologic sequela. The patient is tolerating medications without difficulties and is otherwise without complaint today.  ? ?Past Medical History:  ?Diagnosis Date  ? A-fib ([place])   ? Depression   ? Hyperlipidemia   ? Hypertension   ? ?Past Surgical History:  ?Procedure Laterality Date  ? ABDOMINAL HYSTERECTOMY    ? APPENDECTOMY    ? CARDIOVERSION N/A  02/17/2016  ? Procedure: CARDIOVERSION;  Surgeon: Pixie Casino, MD;  Location: [place];  Service: Cardiovascular;  Laterality: N/A;  ? CARDIOVERSION N/A 05/14/2017  ? Procedure: CARDIOVERSION;  Surgeon: Larey Dresser, MD;  Location[facility] ENDOSCOPY;  Service: Cardiovascular;  Laterality: N/A;  ? ? ?Current Outpatient Medications  ?Medication Sig Dispense Refill  ? ADVAIR DISKUS 100-50 MCG/ACT AEPB 1 puff 2 (two) times daily as needed.    ? apixaban (ELIQUIS) 5 MG TABS tablet Take 1 tablet (5 mg total) by mouth 2 (two) times daily. 56 tablet 0  ? busPIRone (BUSPAR) 5 MG tablet Take 1 tablet (5 mg total) by mouth 3 (three)  times daily as needed. 15 tablet 1  ? Cholecalciferol (VITAMIN D3) 2000 units TABS Take 2,000 Units 2 (two) times a week by mouth.    ? diltiazem (CARDIZEM CD) 120 MG 24 hr capsule Take 1 capsule (120 mg total) by mouth 2 (two) times daily. 60 capsule 2  ? traMADol (ULTRAM) 50 MG tablet Take 50 mg by mouth as needed.     ? FEROSUL 325 (65 Fe) MG tablet Take 325 mg by mouth daily. (Patient not taking: Reported on 09/12/2021)    ? ?No current facility-administered medications for this encounter.  ? ? ?No Known Allergies ? ?Social History  ? ?Socioeconomic History  ? Marital status: Single  ?  Spouse name: Not on file  ? Number of children: Not on file  ? Years of education: Not on file  ? Highest education level: Not on file  ?Occupational History  ? Not on file  ?Tobacco Use  ? Smoking status: Never  ? Smokeless tobacco: Never  ?Substance and Sexual Activity  ? Alcohol use: No  ? Drug use: No  ? Sexual activity: Not on file  ?Other Topics Concern  ? Not on file  ?Social History Narrative  ? Not on file  ? ?Social Determinants of Health  ? ?Financial Resource Strain: Not on file  ?Food Insecurity: Not on file  ?Transportation Needs: Not on file  ?Physical Activity: Not on file  ?Stress: Not on file  ?Social Connections: Not on file  ?Intimate Partner Violence: Not on file  ? ? ?Family History   ?Problem Relation Age of Onset  ? Heart failure Mother   ? ? ?ROS- All systems are reviewed and negative except as per the HPI above ? ?Physical Exam: ?Vitals:  ? 10/10/21 1325  ?Weight: 87.7 kg  ?Height: 5\' 6"  (1.67

## 2021-10-10 NOTE — Progress Notes (Signed)
Patient ID: Elizabeth Sherman, female   DOB: [birth year], 81 y.o.   MRN: [id number] ? ? ? ? ? ?Primary Care Physician: Yvone Neu, MD ?Referring Physician:  ER f/u ? ? ?Elizabeth Sherman is a 82 y.o. female with a h/o new onset afib 01/14/16. This was dx at her PCP, picked up at the exam, pt was unaware. She then decided she would go to the ER for her heart to be evaluated. She was asymptomatic. She was given one eliquis for that evening, rx for cardizem, and was referred here. ? ?She was seen in the afib clinic for the first time, 01/20/16, by then on cardizem and was in afib at 53 bpm,  stated that she feels well and denies any shortness of breath, or fatigue. 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 She does not thinks she snores, no smoking, no alcohol, no large amounts of caffeine.Chadsvasc score of at least 4. ? ?Pt here for f/u, 8/14/`7 and is feeling well. She had one episode of chest pain for which she was evaluated for at [facility] in the ER, 01/23/16, and it had resolved by the time she reached the ER and has not returned. She is still unaware of afib but would like to attempt cardioversion when she has bee on blood thinner x 3 weeks. No issues with the anticoagulant and is taking regularly. ? ?Returns to the afib clinic 02/27/16, f/u cardioversion. This was successful and she remains in SR. She thinks she may see slight improvement in SR but no significant change in her overall energy level.Remains on apixaban. ? ?F/u in a fib clinc 05/28/16. She has been staying in [place]. She feels well. Continues on eliquis, no bleeding issues. ? ?F/u in afib clinic 02/22/17 and she is in afib at 92 bpm. She is unaware. She is under extra stress from her daughter being diagnosed with cancer and is coming with her to the Ca center at [place] 5 days a week, from [place].Marland Kitchen She continues on eliquis. ? ?F/u in the afib clinic, 03/13/17. She continues with afib, rate controlled. She is minimally  symptomatic with this but would like to undergo another cardioversion, however, when to schedule it is up in the air, as her schedule is so full transporting her daughter to radiation. No missed dosed of eliquis. ? ?F/u in afib clinic, 04/18/17, pt had been set up for cardioversion several weeks ago but called and cancelled as she developed an URI. She is now getting over it and is back in the afib clinic to further discuss. I am concerned that now she has been in afib for 2 months and her atrium are severly dilated. I am surprised that she stayed in SR as well as she did since last cardioversion about 2 years ago. ? ?F/u in afib clinic 05/02/17, she has been started on flecainide  50 mg bid, after a low risk stress test. EKG shows no interval change but  remains in afib, rate controlled. She is over her URI and is feeling better from that standpoint. No missed doses of eliquis. ? ?F/u in afib clinic, after successful cardioversion 9/01,but she feels dizzy despite being in SR.Marland Kitchen She called office last week with these c/o and flecainide was reduced to 50 mg bid. Ekg shows SR at 85 bpm, intervals OK. Will try to reduce to 25 mg bid and see if symptoms improve. She refuses a flecainide level today. ? ?F/u  in fib clinic, 08/12/17. She is staying in [place]. She feels well. She called in after cardioversion c/o dizziness and flecainide was reduced to 25 mg bid. This has kept her in SR and eliminated her c/o dizziness. ? ?F/u in afib clinic 02/03/18. She is doing well staying in SR on flecainide. Voices no complaints today. ? ?F/u in afib clinic, 12/09/18 and is in [place]. She has not noted any afib and is staying in rhythm on flecainide 50 mg bid. No issues with DOAC. She was having some dizziness and her PCP reduced her losartan to 25 mg daily and this resolved the dizziness which reasonable BP control. ? ?F/u in afib clinic, 03/27/19. She feels well. No awareness of afib. Continues on flecainide. No bleeding issues with  eliquis. ? ?F/u 09/29/19. She states no awareness of afib. Continues on flecainide. No issues with bleeding on eliquis with a CHA2DS2VASc score of 4. ? ?F/u in afib clinic, 04/01/20. She is staying in SR on flecainide. No awareness of afib. No issues with bleeding, remains on 5 mg eliquis bid.  CHA2DS2VASc score is  at least 4. ? ?F/u in afib clinic, 08/31/20. She  reports feeling very well. No symptoms of afib. She continues on eliquis with a CHA2DS2VASc score of 4. ? ?F/u in afib clinic, 03/22/21. She continues on flecainide with a CHA2DS2VASc score of 4. No issues with bleeding. No afib to report.  ? ?F/u in afib office, 09/12/21,  with afib noted last week at time of eye surgery. Pt is asymptomatic. She would prefer not to have a CV if possible. Discussed increasing rate control first and she is in agreement.  ? ?F/u in afib clinic 09/27/21 and she remains in rate controlled afib. No missed anticoagulation. Is compliant with flecainide. She states thjat the afib is not bothering her and she really does not want another cardioversion.  ? ?F/u in afib clinic, 10/10/21, as pt called back to clinic several days after last being seen and decided that she did want a cardioversion. She has recently noted more shortness of breath. She is not retaining fluid , as her weight is down 4 lbs. I stopped flecainide on last visit as pt felt she wanted a rate control strategy but she has been back on flecainide 50 mg bid x one week.  ?  ? Today, she denies symptoms of palpitations, chest pain, shortness of breath, orthopnea, PND, lower extremity edema, dizziness, presyncope, syncope, or neurologic sequela. The patient is tolerating medications without difficulties and is otherwise without complaint today.  ? ?Past Medical History:  ?Diagnosis Date  ? A-fib ([place])   ? Depression   ? Hyperlipidemia   ? Hypertension   ? ?Past Surgical History:  ?Procedure Laterality Date  ? ABDOMINAL HYSTERECTOMY    ? APPENDECTOMY    ? CARDIOVERSION N/A  02/17/2016  ? Procedure: CARDIOVERSION;  Surgeon: Pixie Casino, MD;  Location: [place];  Service: Cardiovascular;  Laterality: N/A;  ? CARDIOVERSION N/A 05/14/2017  ? Procedure: CARDIOVERSION;  Surgeon: Larey Dresser, MD;  [facility] ENDOSCOPY;  Service: Cardiovascular;  Laterality: N/A;  ? ? ?Current Outpatient Medications  ?Medication Sig Dispense Refill  ? ADVAIR DISKUS 100-50 MCG/ACT AEPB 1 puff 2 (two) times daily as needed.    ? apixaban (ELIQUIS) 5 MG TABS tablet Take 1 tablet (5 mg total) by mouth 2 (two) times daily. 56 tablet 0  ? busPIRone (BUSPAR) 5 MG tablet Take 1 tablet (5 mg total) by mouth 3 (three)  times daily as needed. 15 tablet 1  ? Cholecalciferol (VITAMIN D3) 2000 units TABS Take 2,000 Units 2 (two) times a week by mouth.    ? diltiazem (CARDIZEM CD) 120 MG 24 hr capsule Take 1 capsule (120 mg total) by mouth 2 (two) times daily. 60 capsule 2  ? traMADol (ULTRAM) 50 MG tablet Take 50 mg by mouth as needed.     ? FEROSUL 325 (65 Fe) MG tablet Take 325 mg by mouth daily. (Patient not taking: Reported on 09/12/2021)    ? ?No current facility-administered medications for this encounter.  ? ? ?No Known Allergies ? ?Social History  ? ?Socioeconomic History  ? Marital status: Single  ?  Spouse name: Not on file  ? Number of children: Not on file  ? Years of education: Not on file  ? Highest education level: Not on file  ?Occupational History  ? Not on file  ?Tobacco Use  ? Smoking status: Never  ? Smokeless tobacco: Never  ?Substance and Sexual Activity  ? Alcohol use: No  ? Drug use: No  ? Sexual activity: Not on file  ?Other Topics Concern  ? Not on file  ?Social History Narrative  ? Not on file  ? ?Social Determinants of Health  ? ?Financial Resource Strain: Not on file  ?Food Insecurity: Not on file  ?Transportation Needs: Not on file  ?Physical Activity: Not on file  ?Stress: Not on file  ?Social Connections: Not on file  ?Intimate Partner Violence: Not on file  ? ? ?Family History   ?Problem Relation Age of Onset  ? Heart failure Mother   ? ? ?ROS- All systems are reviewed and negative except as per the HPI above ? ?Physical Exam: ?Vitals:  ? 10/10/21 1325  ?Weight: 87.7 kg  ?Height: 5\' 6"  (1.67

## 2021-10-17 ENCOUNTER — Encounter (HOSPITAL_COMMUNITY): Payer: Self-pay | Admitting: Internal Medicine

## 2021-10-17 NOTE — Progress Notes (Signed)
Attempted to obtain medical history via telephone, unable to reach at this time. I left a voicemail to return pre surgical testing department's phone call.  

## 2021-10-20 ENCOUNTER — Ambulatory Visit (HOSPITAL_COMMUNITY)
Admission: RE | Admit: 2021-10-20 | Discharge: 2021-10-20 | Disposition: A | Discharge status: Home / Self Care | Payer: Medicare Other | Attending: Internal Medicine | Admitting: Internal Medicine

## 2021-10-20 ENCOUNTER — Other Ambulatory Visit: Payer: Self-pay

## 2021-10-20 ENCOUNTER — Encounter (HOSPITAL_COMMUNITY): Payer: Self-pay | Admitting: Internal Medicine

## 2021-10-20 ENCOUNTER — Ambulatory Visit (HOSPITAL_BASED_OUTPATIENT_CLINIC_OR_DEPARTMENT_OTHER): Payer: Medicare Other | Admitting: Anesthesiology

## 2021-10-20 ENCOUNTER — Ambulatory Visit (HOSPITAL_COMMUNITY): Payer: Medicare Other | Admitting: Anesthesiology

## 2021-10-20 ENCOUNTER — Encounter (HOSPITAL_COMMUNITY)
Admission: RE | Disposition: A | Discharge status: Home / Self Care | Payer: Self-pay | Source: Home / Self Care | Attending: Internal Medicine

## 2021-10-20 DIAGNOSIS — I491 Atrial premature depolarization: Secondary | ICD-10-CM | POA: Diagnosis not present

## 2021-10-20 DIAGNOSIS — E785 Hyperlipidemia, unspecified: Secondary | ICD-10-CM | POA: Diagnosis not present

## 2021-10-20 DIAGNOSIS — Z7901 Long term (current) use of anticoagulants: Secondary | ICD-10-CM | POA: Diagnosis not present

## 2021-10-20 DIAGNOSIS — I10 Essential (primary) hypertension: Secondary | ICD-10-CM | POA: Diagnosis not present

## 2021-10-20 DIAGNOSIS — I119 Hypertensive heart disease without heart failure: Secondary | ICD-10-CM | POA: Diagnosis not present

## 2021-10-20 DIAGNOSIS — Z79899 Other long term (current) drug therapy: Secondary | ICD-10-CM | POA: Diagnosis not present

## 2021-10-20 DIAGNOSIS — I4891 Unspecified atrial fibrillation: Secondary | ICD-10-CM

## 2021-10-20 DIAGNOSIS — I4892 Unspecified atrial flutter: Secondary | ICD-10-CM | POA: Diagnosis present

## 2021-10-20 DIAGNOSIS — I4819 Other persistent atrial fibrillation: Secondary | ICD-10-CM | POA: Insufficient documentation

## 2021-10-20 HISTORY — PX: CARDIOVERSION: SHX1299

## 2021-10-20 SURGERY — CARDIOVERSION
Anesthesia: General

## 2021-10-20 MED ORDER — LIDOCAINE 2% (20 MG/ML) 5 ML SYRINGE
INTRAMUSCULAR | Status: DC | PRN
  Administered 2021-10-20: 60 mg via INTRAVENOUS

## 2021-10-20 MED ORDER — PROPOFOL 10 MG/ML IV BOLUS
INTRAVENOUS | Status: DC | PRN
  Administered 2021-10-20: 50 mg via INTRAVENOUS

## 2021-10-20 MED ORDER — SODIUM CHLORIDE 0.9 % IV SOLN: INTRAVENOUS | Status: DC

## 2021-10-20 NOTE — Anesthesia Preprocedure Evaluation (Signed)
Anesthesia Evaluation  ?Patient identified by MRN, date of birth, ID band ?Patient awake ? ? ? ?Reviewed: ?Allergy & Precautions, NPO status , Patient's Chart, lab work & pertinent test results ? ?Airway ?Mallampati: II ? ?TM Distance: >3 FB ?Neck ROM: Full ? ? ? Dental ?no notable dental hx. ? ?  ?Pulmonary ?neg pulmonary ROS,  ?  ?Pulmonary exam normal ?breath sounds clear to auscultation ? ? ? ? ? ? Cardiovascular ?hypertension, Pt. on medications ?Normal cardiovascular exam+ dysrhythmias Atrial Fibrillation  ?Rhythm:Regular Rate:Normal ? ? ?  ?Neuro/Psych ?PSYCHIATRIC DISORDERS Depression negative neurological ROS ?   ? GI/Hepatic ?negative GI ROS, Neg liver ROS,   ?Endo/Other  ?negative endocrine ROS ? Renal/GU ?negative Renal ROS  ?negative genitourinary ?  ?Musculoskeletal ?negative musculoskeletal ROS ?(+)  ? Abdominal ?  ?Peds ?negative pediatric ROS ?(+)  Hematology ?negative hematology ROS ?(+)   ?Anesthesia Other Findings ? ? Reproductive/Obstetrics ?negative OB ROS ? ?  ? ? ? ? ? ? ? ? ? ? ? ? ? ?  ?  ? ? ? ? ? ? ? ? ?Anesthesia Physical ?Anesthesia Plan ? ?ASA: 3 ? ?Anesthesia Plan: General  ? ?Post-op Pain Management:   ? ?Induction: Intravenous ? ?PONV Risk Score and Plan: 3 and Propofol infusion and Ondansetron ? ?Airway Management Planned: Mask ? ?Additional Equipment: None ? ?Intra-op Plan:  ? ?Post-operative Plan:  ? ?Informed Consent: I have reviewed the patients History and Physical, chart, labs and discussed the procedure including the risks, benefits and alternatives for the proposed anesthesia with the patient or authorized representative who has indicated his/her understanding and acceptance.  ? ? ? ? ? ?Plan Discussed with: CRNA, Anesthesiologist and Surgeon ? ?Anesthesia Plan Comments:   ? ? ? ? ? ? ?Anesthesia Quick Evaluation ? ?

## 2021-10-20 NOTE — Transfer of Care (Signed)
Immediate Anesthesia Transfer of Care Note ? ?Patient: Elizabeth Sherman ? ?Procedure(s) Performed: CARDIOVERSION ? ?Patient Location: Endoscopy Unit ? ?Anesthesia Type:General ? ?Level of Consciousness: awake, alert  and oriented ? ?Airway & Oxygen Therapy: Patient Spontanous Breathing ? ?Post-op Assessment: Report given to RN and Post -op Vital signs reviewed and stable ? ?Post vital signs: Reviewed and stable ? ?Last Vitals:  ?Vitals Value Taken Time  ?BP    ?Temp    ?Pulse    ?Resp    ?SpO2    ? ? ?Last Pain:  ?Vitals:  ? 10/20/21 1004  ?PainSc: 0-No pain  ?   ? ?  ? ?Complications: No notable events documented. ?

## 2021-10-20 NOTE — Discharge Instructions (Signed)

## 2021-10-20 NOTE — Anesthesia Procedure Notes (Signed)
Procedure Name: General with mask airway ?Date/Time: 10/20/2021 10:27 AM ?Performed by: Macie Burows, CRNA ?Pre-anesthesia Checklist: Patient identified, Emergency Drugs available, Patient being monitored and Suction available ?Patient Re-evaluated:Patient Re-evaluated prior to induction ?Oxygen Delivery Method: Ambu bag ?Preoxygenation: Pre-oxygenation with 100% oxygen ?Induction Type: IV induction ?Placement Confirmation: positive ETCO2 and breath sounds checked- equal and bilateral ?Dental Injury: Teeth and Oropharynx as per pre-operative assessment  ? ? ? ? ?

## 2021-10-20 NOTE — Interval H&P Note (Signed)
History and Physical Interval Note: ? ?10/20/2021 ?9:50 AM ? ?Elizabeth Sherman  has presented today for surgery, with the diagnosis of AFIB.  The various methods of treatment have been discussed with the patient and family. After consideration of risks, benefits and other options for treatment, the patient has consented to  Procedure(s): ?CARDIOVERSION (N/A) as a surgical intervention.  The patient's history has been reviewed, patient examined, no change in status, stable for surgery.  I have reviewed the patient's chart and labs.  Questions were answered to the patient's satisfaction.   ? ? ?Elizabeth Sherman ? ? ?

## 2021-10-20 NOTE — CV Procedure (Signed)
? ?  CARDIOVERSION NOTE ? ?Procedure: Electrical Cardioversion ?Indications:  Atrial Flutter ? ?Procedure Details: ? ?Consent: Risks of procedure as well as the alternatives and risks of each were explained to the (patient/caregiver).  Consent for procedure obtained. ? ?Time Out: Verified patient identification, verified procedure, site/side was marked, verified correct patient position, special equipment/implants available, medications/allergies/relevent history reviewed, required imaging and test results available.  Performed ? ?Patient placed on cardiac monitor, pulse oximetry, supplemental oxygen as necessary.  ?Sedation given:  propofol per anesthesia ?Pacer pads placed anterior and posterior chest. ? ?Cardioverted 1 time(s).  ?Cardioverted at 200J biphasic. ? ?Impression: ?Findings: Post procedure EKG shows: NSR ?Complications: None ?Patient did tolerate procedure well. ? ?Plan: ?Successful DCCV with a single 200J biphasic shock to NSR. ? ?Time Spent Directly with the Patient: ? ?30 minutes  ? ?Chrystie Nose, MD, [facility], FACP  ?[place]  [facility]  ?Medical Director of the Advanced Lipid Disorders &  ?Cardiovascular Risk Reduction Clinic ?Diplomate of the [employer] of Clinical Lipidology ?Attending Cardiologist  ?Direct Dial: [phone]  Fax: [phone]  ?Website:  [email or web address] ? ?Lisette Abu Lattie Cervi ?10/20/2021, 10:30 AM ? ? ? ? ?

## 2021-10-23 NOTE — Anesthesia Postprocedure Evaluation (Signed)
Anesthesia Post Note ? ?Patient: Elizabeth Sherman ? ?Procedure(s) Performed: CARDIOVERSION ? ?  ? ?Patient location during evaluation: PACU ?Anesthesia Type: General ?Level of consciousness: awake and alert ?Pain management: pain level controlled ?Vital Signs Assessment: post-procedure vital signs reviewed and stable ?Respiratory status: spontaneous breathing, nonlabored ventilation and respiratory function stable ?Cardiovascular status: blood pressure returned to baseline and stable ?Postop Assessment: no apparent nausea or vomiting ?Anesthetic complications: no ? ? ?No notable events documented. ? ?Last Vitals:  ?Vitals:  ? 10/20/21 1044 10/20/21 1054  ?BP: (!) 133/57 (!) 143/75  ?Pulse: (!) 57 (!) 58  ?Resp: 20 18  ?Temp:    ?SpO2: 93% 94%  ?  ?Last Pain:  ?Vitals:  ? 10/20/21 1054  ?TempSrc:   ?PainSc: 0-No pain  ? ? ?  ?  ?  ?  ?  ?  ? ?Mellody Dance ? ? ? ? ?

## 2021-10-25 ENCOUNTER — Ambulatory Visit (HOSPITAL_COMMUNITY)
Admission: RE | Admit: 2021-10-25 | Discharge: 2021-10-25 | Disposition: A | Discharge status: Home / Self Care | Payer: Medicare Other | Source: Ambulatory Visit | Attending: Nurse Practitioner | Admitting: Nurse Practitioner

## 2021-10-25 ENCOUNTER — Encounter (HOSPITAL_COMMUNITY): Payer: Self-pay | Admitting: Nurse Practitioner

## 2021-10-25 VITALS — BP 180/68 | HR 65 | Ht 66.0 in | Wt 192.8 lb

## 2021-10-25 DIAGNOSIS — I10 Essential (primary) hypertension: Secondary | ICD-10-CM | POA: Insufficient documentation

## 2021-10-25 DIAGNOSIS — I4819 Other persistent atrial fibrillation: Secondary | ICD-10-CM | POA: Diagnosis not present

## 2021-10-25 DIAGNOSIS — I4892 Unspecified atrial flutter: Secondary | ICD-10-CM | POA: Insufficient documentation

## 2021-10-25 DIAGNOSIS — D6869 Other thrombophilia: Secondary | ICD-10-CM | POA: Diagnosis not present

## 2021-10-25 DIAGNOSIS — Z79899 Other long term (current) drug therapy: Secondary | ICD-10-CM | POA: Diagnosis not present

## 2021-10-25 LAB — CBC
HCT: 38.1 % (ref 36.0–46.0)
Hemoglobin: 11.2 g/dL — ABNORMAL LOW (ref 12.0–15.0)
MCH: 22.6 pg — ABNORMAL LOW (ref 26.0–34.0)
MCHC: 29.4 g/dL — ABNORMAL LOW (ref 30.0–36.0)
MCV: 77 fL — ABNORMAL LOW (ref 80.0–100.0)
Platelets: 259 10*3/uL (ref 150–400)
RBC: 4.95 MIL/uL (ref 3.87–5.11)
RDW: 18.4 % — ABNORMAL HIGH (ref 11.5–15.5)
WBC: 8.3 10*3/uL (ref 4.0–10.5)
nRBC: 0 % (ref 0.0–0.2)

## 2021-10-25 LAB — BASIC METABOLIC PANEL
Anion gap: 6 (ref 5–15)
BUN: 14 mg/dL (ref 8–23)
CO2: 28 mmol/L (ref 22–32)
Calcium: 9 mg/dL (ref 8.9–10.3)
Chloride: 107 mmol/L (ref 98–111)
Creatinine, Ser: 1.03 mg/dL — ABNORMAL HIGH (ref 0.44–1.00)
GFR, Estimated: 55 mL/min — ABNORMAL LOW (ref >60)
Glucose, Bld: 117 mg/dL — ABNORMAL HIGH (ref 70–99)
Potassium: 4.5 mmol/L (ref 3.5–5.1)
Sodium: 141 mmol/L (ref 135–145)

## 2021-10-25 MED ORDER — DILTIAZEM HCL ER COATED BEADS 120 MG PO CP24: 120.0000 mg | ORAL_CAPSULE | Freq: Every day | ORAL | Status: DC

## 2021-10-25 NOTE — Progress Notes (Signed)
Patient ID: Elizabeth Sherman, female   DOB: 08-19-1940, 81 y.o.   MRN: [id number] ? ? ? ? ? ?Primary Care Physician: Yvone Neu, MD ?Referring Physician:  ER f/u ? ? ?Elizabeth Sherman is a 81 y.o. female with a h/o new onset afib 01/14/16. This was dx at her PCP, picked up at the exam, pt was unaware. She then decided she would go to the ER for her heart to be evaluated. She was asymptomatic. She was given one eliquis for that evening, rx for cardizem, and was referred here. ? ?She was seen in the afib clinic for the first time, 01/20/16, by then on cardizem and was in afib at 53 bpm,  stated that she feels well and denies any shortness of breath, or fatigue. 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 She does not thinks she snores, no smoking, no alcohol, no large amounts of caffeine.Chadsvasc score of at least 4. ? ?Pt here for f/u, 8/14/`7 and is feeling well. She had one episode of chest pain for which she was evaluated for at [facility] hospital in the ER, 01/23/16, and it had resolved by the time she reached the ER and has not returned. She is still unaware of afib but would like to attempt cardioversion when she has bee on blood thinner x 3 weeks. No issues with the anticoagulant and is taking regularly. ? ?Returns to the afib clinic 02/27/16, f/u cardioversion. This was successful and she remains in SR. She thinks she may see slight improvement in SR but no significant change in her overall energy level.Remains on apixaban. ? ?F/u in a fib clinc 05/28/16. She has been staying in [place]. She feels well. Continues on eliquis, no bleeding issues. ? ?F/u in afib clinic 02/22/17 and she is in afib at 92 bpm. She is unaware. She is under extra stress from her daughter being diagnosed with cancer and is coming with her to the Ca center at [place] 5 days a week, from [place].Marland Kitchen She continues on eliquis. ? ?F/u in the afib clinic, 03/13/17. She continues with afib, rate controlled. She is minimally  symptomatic with this but would like to undergo another cardioversion, however, when to schedule it is up in the air, as her schedule is so full transporting her daughter to radiation. No missed dosed of eliquis. ? ?F/u in afib clinic, 04/18/17, pt had been set up for cardioversion several weeks ago but called and cancelled as she developed an URI. She is now getting over it and is back in the afib clinic to further discuss. I am concerned that now she has been in afib for 2 months and her atrium are severly dilated. I am surprised that she stayed in SR as well as she did since last cardioversion about 2 years ago. ? ?F/u in afib clinic 05/02/17, she has been started on flecainide  50 mg bid, after a low risk stress test. EKG shows no interval change but  remains in afib, rate controlled. She is over her URI and is feeling better from that standpoint. No missed doses of eliquis. ? ?F/u in afib clinic, after successful cardioversion 9/01,but she feels dizzy despite being in SR.Marland Kitchen She called office last week with these c/o and flecainide was reduced to 50 mg bid. Ekg shows SR at 85 bpm, intervals OK. Will try to reduce to 25 mg bid and see if symptoms improve. She refuses a flecainide level today. ? ?F/u  in fib clinic, 08/12/17. She is staying in [place]. She feels well. She called in after cardioversion c/o dizziness and flecainide was reduced to 25 mg bid. This has kept her in SR and eliminated her c/o dizziness. ? ?F/u in afib clinic 02/03/18. She is doing well staying in SR on flecainide. Voices no complaints today. ? ?F/u in afib clinic, 12/09/18 and is in [place]. She has not noted any afib and is staying in rhythm on flecainide 50 mg bid. No issues with DOAC. She was having some dizziness and her PCP reduced her losartan to 25 mg daily and this resolved the dizziness which reasonable BP control. ? ?F/u in afib clinic, 03/27/19. She feels well. No awareness of afib. Continues on flecainide. No bleeding issues with  eliquis. ? ?F/u 09/29/19. She states no awareness of afib. Continues on flecainide. No issues with bleeding on eliquis with a CHA2DS2VASc score of 4. ? ?F/u in afib clinic, 04/01/20. She is staying in SR on flecainide. No awareness of afib. No issues with bleeding, remains on 5 mg eliquis bid.  CHA2DS2VASc score is  at least 4. ? ?F/u in afib clinic, 08/31/20. She  reports feeling very well. No symptoms of afib. She continues on eliquis with a CHA2DS2VASc score of 4. ? ?F/u in afib clinic, 03/22/21. She continues on flecainide with a CHA2DS2VASc score of 4. No issues with bleeding. No afib to report.  ? ?F/u in afib office, 09/12/21,  with afib noted last week at time of eye surgery. Pt is asymptomatic. She would prefer not to have a CV if possible. Discussed increasing rate control first and she is in agreement.  ? ?F/u in afib clinic 09/27/21 and she remains in rate controlled afib. No missed anticoagulation. Is compliant with flecainide. She states thjat the afib is not bothering her and she really does not want another cardioversion.  ? ?F/u in afib clinic, 10/10/21, as pt called back to clinic several days after last being seen and decided that she did want a cardioversion. She has recently noted more shortness of breath. She is not retaining fluid , as her weight is down 4 lbs. I stopped flecainide on last visit as pt felt she wanted a rate control strategy but she has been back on flecainide 50 mg bid x one week.  ? ?F/u in afib clinic, 10/25/21. She had a successful cardioversion and is in SR today. BP elevated on presentation, rechecked with a systolic of [id number]. She states at home, her BP is usually under [id number] systolic. ?  ? Today, she denies symptoms of palpitations, chest pain, shortness of breath, orthopnea, PND, lower extremity edema, dizziness, presyncope, syncope, or neurologic sequela. The patient is tolerating medications without difficulties and is otherwise without complaint today.  ? ?Past Medical History:   ?Diagnosis Date  ? A-fib ([place])   ? Depression   ? Hyperlipidemia   ? Hypertension   ? ?Past Surgical History:  ?Procedure Laterality Date  ? ABDOMINAL HYSTERECTOMY    ? APPENDECTOMY    ? CARDIOVERSION N/A 02/17/2016  ? Procedure: CARDIOVERSION;  Surgeon: Pixie Casino, MD;  Location: [facility];  Service: Cardiovascular;  Laterality: N/A;  ? CARDIOVERSION N/A 05/14/2017  ? Procedure: CARDIOVERSION;  Surgeon: Larey Dresser, MD;  Location[facility] ENDOSCOPY;  Service: Cardiovascular;  Laterality: N/A;  ? CARDIOVERSION N/A 10/20/2021  ? Procedure: CARDIOVERSION;  Surgeon: Pixie Casino, MD;  Location: [facility];  Service: Cardiovascular;  Laterality: N/A;  ? ? ?Current Outpatient Medications  ?  Medication Sig Dispense Refill  ? ADVAIR DISKUS 100-50 MCG/ACT AEPB Inhale 1 puff into the lungs 2 (two) times daily as needed (Asthma).    ? apixaban (ELIQUIS) 5 MG TABS tablet Take 1 tablet (5 mg total) by mouth 2 (two) times daily. 56 tablet 0  ? busPIRone (BUSPAR) 5 MG tablet Take 1 tablet (5 mg total) by mouth 3 (three) times daily as needed. 15 tablet 1  ? Cholecalciferol (VITAMIN D3) 2000 units TABS Take 2,000 Units 2 (two) times a week by mouth.    ? FEROSUL 325 (65 Fe) MG tablet Take 325 mg by mouth daily.    ? flecainide (TAMBOCOR) 50 MG tablet Take 50 mg by mouth 2 (two) times daily.    ? traMADol (ULTRAM) 50 MG tablet Take 50 mg by mouth every 6 (six) hours as needed for severe pain.    ? diltiazem (CARDIZEM CD) 120 MG 24 hr capsule Take 1 capsule (120 mg total) by mouth daily.    ? ?No current facility-administered medications for this encounter.  ? ? ?No Known Allergies ? ?Social History  ? ?Socioeconomic History  ? Marital status: Single  ?  Spouse name: Not on file  ? Number of children: Not on file  ? Years of education: Not on file  ? Highest education level: Not on file  ?Occupational History  ? Not on file  ?Tobacco Use  ? Smoking status: Never  ? Smokeless tobacco: Never  ?Substance and Sexual Activity   ? Alcohol use: No  ? Drug use: No  ? Sexual activity: Not on file  ?Other Topics Concern  ? Not on file  ?Social History Narrative  ? Not on file  ? ?Social Determinants of Health  ? ?Financial Resource Strain: [employer]

## 2021-10-26 ENCOUNTER — Ambulatory Visit (HOSPITAL_COMMUNITY): Payer: Medicare Other | Admitting: Nurse Practitioner

## 2021-10-27 ENCOUNTER — Other Ambulatory Visit (HOSPITAL_COMMUNITY): Payer: Self-pay

## 2021-10-27 MED ORDER — APIXABAN 5 MG PO TABS: 5.0000 mg | ORAL_TABLET | Freq: Two times a day (BID) | ORAL | 0 refills | Status: DC

## 2021-11-09 ENCOUNTER — Ambulatory Visit (HOSPITAL_COMMUNITY): Payer: Medicare Other | Admitting: Nurse Practitioner

## 2021-12-14 ENCOUNTER — Other Ambulatory Visit (HOSPITAL_COMMUNITY): Payer: Self-pay | Admitting: Nurse Practitioner

## 2022-03-24 ENCOUNTER — Other Ambulatory Visit (HOSPITAL_COMMUNITY): Payer: Self-pay | Admitting: Nurse Practitioner

## 2022-03-26 ENCOUNTER — Other Ambulatory Visit (HOSPITAL_COMMUNITY): Payer: Self-pay | Admitting: *Deleted

## 2022-03-26 MED ORDER — DILTIAZEM HCL ER COATED BEADS 120 MG PO CP24: 120.0000 mg | ORAL_CAPSULE | Freq: Every day | ORAL | 1 refills | Status: DC

## 2022-05-01 ENCOUNTER — Encounter (HOSPITAL_COMMUNITY): Payer: Self-pay | Admitting: Nurse Practitioner

## 2022-05-01 ENCOUNTER — Ambulatory Visit (HOSPITAL_COMMUNITY)
Admission: RE | Admit: 2022-05-01 | Discharge: 2022-05-01 | Disposition: A | Discharge status: Home / Self Care | Payer: Medicare Other | Source: Ambulatory Visit | Attending: Nurse Practitioner | Admitting: Nurse Practitioner

## 2022-05-01 VITALS — BP 184/76 | HR 57 | Ht 66.0 in | Wt 180.4 lb

## 2022-05-01 DIAGNOSIS — Z79899 Other long term (current) drug therapy: Secondary | ICD-10-CM | POA: Insufficient documentation

## 2022-05-01 DIAGNOSIS — Z7901 Long term (current) use of anticoagulants: Secondary | ICD-10-CM | POA: Diagnosis not present

## 2022-05-01 DIAGNOSIS — I119 Hypertensive heart disease without heart failure: Secondary | ICD-10-CM | POA: Diagnosis not present

## 2022-05-01 DIAGNOSIS — I4892 Unspecified atrial flutter: Secondary | ICD-10-CM | POA: Insufficient documentation

## 2022-05-01 DIAGNOSIS — I4819 Other persistent atrial fibrillation: Secondary | ICD-10-CM | POA: Diagnosis present

## 2022-05-01 NOTE — Progress Notes (Signed)
Patient ID: Elizabeth Sherman, female   DOB: 1940/10/28, 81 y.o.   MRN: [id number]      Primary Care Physician: Maximiano Coss, MD Referring Physician:  ER f/u   Elizabeth Sherman is a 81 y.o. female with a h/o new onset afib 01/14/16. This was dx at her PCP, picked up at the exam, pt was unaware. She then decided she would go to the ER for her heart to be evaluated. She was asymptomatic. She was given one eliquis for that evening, rx for cardizem, and was referred here.  She was seen in the afib clinic for the first time, 01/20/16, by then on cardizem and was in afib at 53 bpm,  stated that she feels well and denies any shortness of breath, or fatigue. 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 She does not thinks she snores, no smoking, no alcohol, no large amounts of caffeine.Chadsvasc score of at least 4.  Pt here for f/u, 8/14/`7 and is feeling well. She had one episode of chest pain for which she was evaluated for at [facility] hospital in the ER, 01/23/16, and it had resolved by the time she reached the ER and has not returned. She is still unaware of afib but would like to attempt cardioversion when she has bee on blood thinner x 3 weeks. No issues with the anticoagulant and is taking regularly.  Returns to the afib clinic 02/27/16, f/u cardioversion. This was successful and she remains in SR. She thinks she may see slight improvement in SR but no significant change in her overall energy level.Remains on apixaban.  F/u in a fib clinc 05/28/16. She has been staying in SR. She feels well. Continues on eliquis, no bleeding issues.  F/u in afib clinic 02/22/17 and she is in afib at 92 bpm. She is unaware. She is under extra stress from her daughter being diagnosed with cancer and is coming with her to the Ca center at [place] 5 days a week, from [place].Marland Kitchen She continues on eliquis.  F/u in the afib clinic, 03/13/17. She continues with afib, rate controlled. She is minimally  symptomatic with this but would like to undergo another cardioversion, however, when to schedule it is up in the air, as her schedule is so full transporting her daughter to radiation. No missed dosed of eliquis.  F/u in afib clinic, 04/18/17, pt had been set up for cardioversion several weeks ago but called and cancelled as she developed an URI. She is now getting over it and is back in the afib clinic to further discuss. I am concerned that now she has been in afib for 2 months and her atrium are severly dilated. I am surprised that she stayed in SR as well as she did since last cardioversion about 2 years ago.  F/u in afib clinic 05/02/17, she has been started on flecainide  50 mg bid, after a low risk stress test. EKG shows no interval change but  remains in afib, rate controlled. She is over her URI and is feeling better from that standpoint. No missed doses of eliquis.  F/u in afib clinic, after successful cardioversion 9/01,but she feels dizzy despite being in SR.Marland Kitchen She called office last week with these c/o and flecainide was reduced to 50 mg bid. Ekg shows SR at 85 bpm, intervals OK. Will try to reduce to 25 mg bid and see if symptoms improve. She refuses a flecainide level today.  F/u  in fib clinic, 08/12/17. She is staying in SR. She feels well. She called in after cardioversion c/o dizziness and flecainide was reduced to 25 mg bid. This has kept her in SR and eliminated her c/o dizziness.  F/u in afib clinic 02/03/18. She is doing well staying in SR on flecainide. Voices no complaints today.  F/u in afib clinic, 12/09/18 and is in SR. She has not noted any afib and is staying in rhythm on flecainide 50 mg bid. No issues with DOAC. She was having some dizziness and her PCP reduced her losartan to 25 mg daily and this resolved the dizziness which reasonable BP control.  F/u in afib clinic, 03/27/19. She feels well. No awareness of afib. Continues on flecainide. No bleeding issues with  eliquis.  F/u 09/29/19. She states no awareness of afib. Continues on flecainide. No issues with bleeding on eliquis with a CHA2DS2VASc score of 4.  F/u in afib clinic, 04/01/20. She is staying in SR on flecainide. No awareness of afib. No issues with bleeding, remains on 5 mg eliquis bid.  CHA2DS2VASc score is  at least 4.  F/u in afib clinic, 08/31/20. She  reports feeling very well. No symptoms of afib. She continues on eliquis with a CHA2DS2VASc score of 4.  F/u in afib clinic, 03/22/21. She continues on flecainide with a CHA2DS2VASc score of 4. No issues with bleeding. No afib to report.   F/u in afib office, 09/12/21,  with afib noted last week at time of eye surgery. Pt is asymptomatic. She would prefer not to have a CV if possible. Discussed increasing rate control first and she is in agreement.   F/u in afib clinic 09/27/21 and she remains in rate controlled afib. No missed anticoagulation. Is compliant with flecainide. She states thjat the afib is not bothering her and she really does not want another cardioversion.   F/u in afib clinic, 10/10/21, as pt called back to clinic several days after last being seen and decided that she did want a cardioversion. She has recently noted more shortness of breath. She is not retaining fluid , as her weight is down 4 lbs. I stopped flecainide on last visit as pt felt she wanted a rate control strategy but she has been back on flecainide 50 mg bid x one week.   F/u in afib clinic, 10/25/21. She had a successful cardioversion and is in SR today. BP elevated on presentation, rechecked with a systolic of 136. She states at home, her BP is usually under 130 systolic.  F/u in afib clinic, 05/01/22. She remains in SR. She has lost around 24 lbs. She has been under stress with the health of her boyfriend and is not eating as much.He is living with her  after his stroke. She remains in SR and on flecainide.     Today, she denies symptoms of palpitations, chest  pain, shortness of breath, orthopnea, PND, lower extremity edema, dizziness, presyncope, syncope, or neurologic sequela. The patient is tolerating medications without difficulties and is otherwise without complaint today.   Past Medical History:  Diagnosis Date   A-fib [facility])    Depression    Hyperlipidemia    Hypertension    Past Surgical History:  Procedure Laterality Date   ABDOMINAL HYSTERECTOMY     APPENDECTOMY     CARDIOVERSION N/A 02/17/2016   Procedure: CARDIOVERSION;  Surgeon: Chrystie Nose, MD;  Location: [facility] ENDOSCOPY;  Service: Cardiovascular;  Laterality: N/A;   CARDIOVERSION N/A 05/14/2017  Procedure: CARDIOVERSION;  Surgeon: Laurey MoraleMcLean, Dalton S, MD;  [facility];  Service: Cardiovascular;  Laterality: N/A;   CARDIOVERSION N/A 10/20/2021   Procedure: CARDIOVERSION;  Surgeon: Chrystie NoseHilty, Kenneth C, MD;  Location: [facility] ENDOSCOPY;  Service: Cardiovascular;  Laterality: N/A;    Current Outpatient Medications  Medication Sig Dispense Refill   ADVAIR DISKUS 100-50 MCG/ACT AEPB Inhale 1 puff into the lungs 2 (two) times daily as needed (Asthma).     apixaban (ELIQUIS) 5 MG TABS tablet Take 1 tablet (5 mg total) by mouth 2 (two) times daily. 56 tablet 0   busPIRone (BUSPAR) 5 MG tablet Take 1 tablet (5 mg total) by mouth 3 (three) times daily as needed. 15 tablet 1   Cholecalciferol (VITAMIN D3) 2000 units TABS Take 2,000 Units 2 (two) times a week by mouth.     diltiazem (CARDIZEM CD) 120 MG 24 hr capsule Take 1 capsule (120 mg total) by mouth daily. 90 capsule 1   FEROSUL 325 (65 Fe) MG tablet Take 325 mg by mouth daily.     flecainide (TAMBOCOR) 50 MG tablet Take 1 tablet (50 mg total) by mouth 2 (two) times daily. 60 tablet 6   traMADol (ULTRAM) 50 MG tablet Take 50 mg by mouth every 6 (six) hours as needed for severe pain.     No current facility-administered medications for this encounter.    No Known Allergies  Social History   Socioeconomic History   Marital  status: Single    Spouse name: Not on file   Number of children: Not on file   Years of education: Not on file   Highest education level: Not on file  Occupational History   Not on file  Tobacco Use   Smoking status: Never   Smokeless tobacco: Never  Substance and Sexual Activity   Alcohol use: No   Drug use: No   Sexual activity: Not on file  Other Topics Concern   Not on file  Social History Narrative   Not on file   Social Determinants of Health   Financial Resource Strain: Not on file  Food Insecurity: Not on file  Transportation Needs: Not on file  Physical Activity: Not on file  Stress: Not on file  Social Connections: Not on file  Intimate Partner Violence: Not on file    Family History  Problem Relation Age of Onset   Heart failure Mother     ROS- All systems are reviewed and negative except as per the HPI above  Physical Exam: Vitals:   05/01/22 1109  BP: (!) 184/76  Pulse: (!) 57  Weight: 81.8 kg  Height: 5\' 6"  (1.676 m)    GEN- The patient is well appearing, alert and oriented x 3 today.   Head- normocephalic, atraumatic Eyes-  Sclera clear, conjunctiva pink Ears- hearing intact Oropharynx- clear Neck- supple, no JVP Lymph- no cervical lymphadenopathy Lungs- Clear to ausculation bilaterally, normal work of breathing Heart- regular rate and rhythm, no murmurs, rubs or gallops, PMI not laterally displaced GI- soft, NT, ND, + BS Extremities- no clubbing, cyanosis, or edema MS- no significant deformity or atrophy Skin- no rash or lesion Psych- euthymic mood, full affect Neuro- strength and sensation are intact  EKG - Vent. rate 57 BPM PR interval 204 ms QRS duration 98 ms QT/QTcB 460/447 ms P-R-T axes 23 -27 -5 Sinus bradycardia Minimal voltage criteria for LVH, may be normal variant ( Cornell product ) Nonspecific T wave abnormality Abnormal ECG When compared with ECG  of 25-Oct-2021 15:13, PREVIOUS ECG IS PRESENT    Echo--8/17- Left  ventricle: The cavity size was normal. Wall thickness was   increased in a pattern of mild LVH. Systolic function was normal.   The estimated ejection fraction was in the range of 60% to 65%.   Indeterminant diastolic function (atrial fibrillation). Wall   motion was normal; there were no regional wall motion   abnormalities. - Aortic valve: There was no stenosis. - Mitral valve: There was trivial regurgitation. - Left atrium: The atrium was severely dilated, 41 mm, diameter, LA volume 103 ml - Right ventricle: The cavity size was normal. Systolic function   was normal. - Right atrium: The atrium was severely dilated. - Pulmonary arteries: No complete TR doppler jet so unable to   estimate PA systolic pressure. - Inferior vena cava: The vessel was normal in size. The   respirophasic diameter changes were in the normal range (>= 50%),   consistent with normal central venous pressure. - Pericardium, extracardiac: Prominent epicardial adipose tissue.   Impressions:   - The patient was in atrial fibrillation. Normal LV size with mild   LV hypertrophy. EF 60-65%. Normal RV size and systolic function.   No significant valvular abnormalities. Severe biatrial   enlargement.  Stress test-Nuclear stress EF: 74%. There was no ST segment deviation noted during stress. The study is normal. The left ventricular ejection fraction is hyperdynamic (>65%).   1. EF 74%, normal wall motion.  2. No evidence for ischemia or infarction on perfusion images.    Normal study.   Assessment and Plan:  1. Persistent  afib/flutter  Had been maintaining SR on flecainide 50 mg bid until afib noted with eye surgery in March   She had a successful cardioversion 5/5 and remains in SR, EKG stable  Continue flecainide 50 mg bid and diltiazem 120 mg daily  Continue eliquis 5 mg bid,  chadsvasc score is at least 4  2. HTN Elevated on presentation, better on recheck at 150/80 Home BP's are controlled  per  pt  F/u up in 6 months    Lupita Leash [facility] [address] [place], [place] [address] [phone]

## 2022-07-20 ENCOUNTER — Other Ambulatory Visit (HOSPITAL_COMMUNITY): Payer: Self-pay

## 2022-07-20 MED ORDER — APIXABAN 5 MG PO TABS
5.0000 mg | ORAL_TABLET | Freq: Two times a day (BID) | ORAL | 0 refills | Status: DC
Start: 2022-07-20 — End: 2022-10-08

## 2022-07-23 ENCOUNTER — Other Ambulatory Visit (HOSPITAL_COMMUNITY): Payer: Self-pay | Admitting: Nurse Practitioner

## 2022-07-26 ENCOUNTER — Encounter (HOSPITAL_COMMUNITY): Payer: Self-pay | Admitting: *Deleted

## 2022-08-08 DIAGNOSIS — S72001A Fracture of unspecified part of neck of right femur, initial encounter for closed fracture: Secondary | ICD-10-CM | POA: Insufficient documentation

## 2022-09-26 ENCOUNTER — Other Ambulatory Visit (HOSPITAL_COMMUNITY): Payer: Self-pay | Admitting: *Deleted

## 2022-09-26 MED ORDER — DILTIAZEM HCL ER COATED BEADS 120 MG PO CP24: 120.0000 mg | ORAL_CAPSULE | Freq: Every day | ORAL | 1 refills | Status: DC

## 2022-10-08 ENCOUNTER — Other Ambulatory Visit (HOSPITAL_COMMUNITY): Payer: Self-pay | Admitting: *Deleted

## 2022-10-08 MED ORDER — APIXABAN 5 MG PO TABS: 5.0000 mg | ORAL_TABLET | Freq: Two times a day (BID) | ORAL | 6 refills | Status: DC

## 2022-10-31 ENCOUNTER — Ambulatory Visit (HOSPITAL_COMMUNITY)
Admission: RE | Admit: 2022-10-31 | Discharge: 2022-10-31 | Disposition: A | Discharge status: Home / Self Care | Payer: Medicare Other | Source: Ambulatory Visit | Attending: Physician Assistant | Admitting: Physician Assistant

## 2022-10-31 VITALS — BP 132/72 | HR 59 | Ht 66.0 in | Wt 172.2 lb

## 2022-10-31 DIAGNOSIS — Z5181 Encounter for therapeutic drug level monitoring: Secondary | ICD-10-CM

## 2022-10-31 DIAGNOSIS — Z79899 Other long term (current) drug therapy: Secondary | ICD-10-CM | POA: Insufficient documentation

## 2022-10-31 DIAGNOSIS — D6869 Other thrombophilia: Secondary | ICD-10-CM | POA: Diagnosis not present

## 2022-10-31 DIAGNOSIS — I4892 Unspecified atrial flutter: Secondary | ICD-10-CM | POA: Diagnosis not present

## 2022-10-31 DIAGNOSIS — I4819 Other persistent atrial fibrillation: Secondary | ICD-10-CM | POA: Diagnosis present

## 2022-10-31 DIAGNOSIS — I10 Essential (primary) hypertension: Secondary | ICD-10-CM | POA: Diagnosis not present

## 2022-10-31 DIAGNOSIS — Z7901 Long term (current) use of anticoagulants: Secondary | ICD-10-CM | POA: Diagnosis not present

## 2022-10-31 NOTE — Progress Notes (Signed)
Primary Care Physician: Maximiano Coss, MD Referring Physician:  ER f/u   Elizabeth Sherman is a 82 y.o. female with a h/o new onset afib 01/14/16. This was dx at her PCP, picked up at the exam, pt was unaware. She then decided she would go to the ER for her heart to be evaluated. She was asymptomatic. She was given one eliquis for that evening, rx for cardizem, and was referred here.  She was seen in the afib clinic for the first time, 01/20/16, by then on cardizem and was in afib at 53 bpm,  stated that she feels well and denies any shortness of breath, or fatigue. 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 She does not thinks she snores, no smoking, no alcohol, no large amounts of caffeine.Chadsvasc score of at least 4.  Pt here for f/u, 8/14/`7 and is feeling well. She had one episode of chest pain for which she was evaluated for at [facility] hospital in the ER, 01/23/16, and it had resolved by the time she reached the ER and has not returned. She is still unaware of afib but would like to attempt cardioversion when she has bee on blood thinner x 3 weeks. No issues with the anticoagulant and is taking regularly.  Returns to the afib clinic 02/27/16, f/u cardioversion. This was successful and she remains in SR. She thinks she may see slight improvement in SR but no significant change in her overall energy level.Remains on apixaban.  F/u in a fib clinc 05/28/16. She has been staying in SR. She feels well. Continues on eliquis, no bleeding issues.  F/u in afib clinic 02/22/17 and she is in afib at 92 bpm. She is unaware. She is under extra stress from her daughter being diagnosed with cancer and is coming with her to the Ca center at [facility] 5 days a week, from [place].Marland Kitchen She continues on eliquis.  F/u in the afib clinic, 03/13/17. She continues with afib, rate controlled. She is minimally symptomatic with this but would like to undergo another cardioversion, however,  when to schedule it is up in the air, as her schedule is so full transporting her daughter to radiation. No missed dosed of eliquis.  F/u in afib clinic, 04/18/17, pt had been set up for cardioversion several weeks ago but called and cancelled as she developed an URI. She is now getting over it and is back in the afib clinic to further discuss. I am concerned that now she has been in afib for 2 months and her atrium are severly dilated. I am surprised that she stayed in SR as well as she did since last cardioversion about 2 years ago.  F/u in afib clinic 05/02/17, she has been started on flecainide  50 mg bid, after a low risk stress test. EKG shows no interval change but  remains in afib, rate controlled. She is over her URI and is feeling better from that standpoint. No missed doses of eliquis.  F/u in afib clinic, after successful cardioversion 9/01,but she feels dizzy despite being in SR.Marland Kitchen She called office last week with these c/o and flecainide was reduced to 50 mg bid. Ekg shows SR at 85 bpm, intervals OK. Will try to reduce to 25 mg bid and see if symptoms improve. She refuses a flecainide level today.  F/u in fib clinic, 08/12/17. She is staying in SR. She feels well. She called in after cardioversion c/o  dizziness and flecainide was reduced to 25 mg bid. This has kept her in SR and eliminated her c/o dizziness.  F/u in afib clinic 02/03/18. She is doing well staying in SR on flecainide. Voices no complaints today.  F/u in afib clinic, 12/09/18 and is in SR. She has not noted any afib and is staying in rhythm on flecainide 50 mg bid. No issues with DOAC. She was having some dizziness and her PCP reduced her losartan to 25 mg daily and this resolved the dizziness which reasonable BP control.  F/u in afib clinic, 03/27/19. She feels well. No awareness of afib. Continues on flecainide. No bleeding issues with eliquis.  F/u 09/29/19. She states no awareness of afib. Continues on flecainide. No issues  with bleeding on eliquis with a CHA2DS2VASc score of 4.  F/u in afib clinic, 04/01/20. She is staying in SR on flecainide. No awareness of afib. No issues with bleeding, remains on 5 mg eliquis bid.  CHA2DS2VASc score is  at least 4.  F/u in afib clinic, 08/31/20. She  reports feeling very well. No symptoms of afib. She continues on eliquis with a CHA2DS2VASc score of 4.  F/u in afib clinic, 03/22/21. She continues on flecainide with a CHA2DS2VASc score of 4. No issues with bleeding. No afib to report.   F/u in afib office, 09/12/21,  with afib noted last week at time of eye surgery. Pt is asymptomatic. She would prefer not to have a CV if possible. Discussed increasing rate control first and she is in agreement.   F/u in afib clinic 09/27/21 and she remains in rate controlled afib. No missed anticoagulation. Is compliant with flecainide. She states thjat the afib is not bothering her and she really does not want another cardioversion.   F/u in afib clinic, 10/10/21, as pt called back to clinic several days after last being seen and decided that she did want a cardioversion. She has recently noted more shortness of breath. She is not retaining fluid , as her weight is down 4 lbs. I stopped flecainide on last visit as pt felt she wanted a rate control strategy but she has been back on flecainide 50 mg bid x one week.   F/u in afib clinic, 10/25/21. She had a successful cardioversion and is in SR today. BP elevated on presentation, rechecked with a systolic of 136. She states at home, her BP is usually under 130 systolic.  F/u in afib clinic, 05/01/22. She remains in SR. She has lost around 24 lbs. She has been under stress with the health of her boyfriend and is not eating as much.He is living with her  after his stroke. She remains in SR and on flecainide.   Follow up in the AF clinic 10/31/22. Patient reports that she has done well from an afib standpoint. She did fall and break her hip in February and  had surgery at [facility]. She is walking with a cane. No bleeding issues on anticoagulation.     Today, she denies symptoms of palpitations, chest pain, shortness of breath, orthopnea, PND, lower extremity edema, dizziness, presyncope, syncope, or neurologic sequela. The patient is tolerating medications without difficulties and is otherwise without complaint today.   Past Medical History:  Diagnosis Date   A-fib [facility])    Depression    Hyperlipidemia    Hypertension    Past Surgical History:  Procedure Laterality Date   ABDOMINAL HYSTERECTOMY     APPENDECTOMY     CARDIOVERSION  N/A 02/17/2016   Procedure: CARDIOVERSION;  Surgeon: Chrystie Nose, MD;  Location: [facility] ENDOSCOPY;  Service: Cardiovascular;  Laterality: N/A;   CARDIOVERSION N/A 05/14/2017   Procedure: CARDIOVERSION;  Surgeon: Laurey Morale, MD;  Location: [facility] ENDOSCOPY;  Service: Cardiovascular;  Laterality: N/A;   CARDIOVERSION N/A 10/20/2021   Procedure: CARDIOVERSION;  Surgeon: Chrystie Nose, MD;  Location: [facility] ENDOSCOPY;  Service: Cardiovascular;  Laterality: N/A;    Current Outpatient Medications  Medication Sig Dispense Refill   apixaban (ELIQUIS) 5 MG TABS tablet Take 1 tablet (5 mg total) by mouth 2 (two) times daily. 60 tablet 6   busPIRone (BUSPAR) 5 MG tablet Take 1 tablet (5 mg total) by mouth 3 (three) times daily as needed. 15 tablet 1   Cholecalciferol (VITAMIN D3) 2000 units TABS Take 2,000 Units by mouth daily.     diltiazem (CARDIZEM CD) 120 MG 24 hr capsule Take 1 capsule (120 mg total) by mouth daily. 90 capsule 1   FEROSUL 325 (65 Fe) MG tablet Take 325 mg by mouth daily.     flecainide (TAMBOCOR) 50 MG tablet Take 1 tablet by mouth twice daily 60 tablet 5   traMADol (ULTRAM) 50 MG tablet Take 50 mg by mouth every 6 (six) hours as needed for severe pain.     No current facility-administered medications for this encounter.    No Known Allergies  Social History   Socioeconomic  History   Marital status: Single    Spouse name: Not on file   Number of children: Not on file   Years of education: Not on file   Highest education level: Not on file  Occupational History   Not on file  Tobacco Use   Smoking status: Never   Smokeless tobacco: Never  Substance and Sexual Activity   Alcohol use: No   Drug use: No   Sexual activity: Not on file  Other Topics Concern   Not on file  Social History Narrative   Not on file   Social Determinants of Health   Financial Resource Strain: Not on file  Food Insecurity: Not on file  Transportation Needs: Not on file  Physical Activity: Not on file  Stress: Not on file  Social Connections: Not on file  Intimate Partner Violence: Not on file    Family History  Problem Relation Age of Onset   Heart failure Mother     ROS- All systems are reviewed and negative except as per the HPI above  Physical Exam: Vitals:   10/31/22 1047  BP: 132/72  Pulse: (!) 59  Weight: 78.1 kg  Height: 5\' 6"  (1.676 m)    GEN- The patient is a well appearing elderly female, alert and oriented x 3 today.   HEENT-head normocephalic, atraumatic, sclera clear, conjunctiva pink, hearing intact, trachea midline. Lungs- Clear to ausculation bilaterally, normal work of breathing Heart- Regular rate and rhythm, no murmurs, rubs or gallops  GI- soft, NT, ND, + BS Extremities- no clubbing, cyanosis, or edema MS- no significant deformity or atrophy Skin- no rash or lesion Psych- euthymic mood, full affect Neuro- strength and sensation are intact   EKG today demonstrates SB, 1st degree AV block, LAFB Vent. rate 59 BPM PR interval 224 ms QRS duration 98 ms QT/QTcB 402/397 ms   Echo--8/17-  Left ventricle: The cavity size was normal. Wall thickness was   increased in a pattern of mild LVH. Systolic function was normal.   The estimated ejection fraction was in the  range of 60% to 65%.   Indeterminant diastolic function (atrial  fibrillation). Wall   motion was normal; there were no regional wall motion   abnormalities. - Aortic valve: There was no stenosis. - Mitral valve: There was trivial regurgitation. - Left atrium: The atrium was severely dilated, 41 mm, diameter, LA volume 103 ml - Right ventricle: The cavity size was normal. Systolic function   was normal. - Right atrium: The atrium was severely dilated. - Pulmonary arteries: No complete TR doppler jet so unable to   estimate PA systolic pressure. - Inferior vena cava: The vessel was normal in size. The   respirophasic diameter changes were in the normal range (>= 50%),   consistent with normal central venous pressure. - Pericardium, extracardiac: Prominent epicardial adipose tissue.   Impressions:   - The patient was in atrial fibrillation. Normal LV size with mild   LV hypertrophy. EF 60-65%. Normal RV size and systolic function.   No significant valvular abnormalities. Severe biatrial   enlargement.   CHA2DS2-VASc Score = 4  The patient's score is based upon: CHF History: 0 HTN History: 1 Diabetes History: 0 Stroke History: 0 Vascular Disease History: 0 Age Score: 2 Gender Score: 1       ASSESSMENT AND PLAN: 1. Persistent Atrial Fibrillation/atrial flutter The patient's CHA2DS2-VASc score is 4, indicating a 4.8% annual risk of stroke.   Patient appears to be maintaining SR.  Continue flecainide 50 mg BID Continue diltiazem 120 mg daily  Continue Eliquis 5 mg BID  2. Secondary Hypercoagulable State (ICD10:  D68.69) The patient is at significant risk for stroke/thromboembolism based upon her CHA2DS2-VASc Score of 4.  Continue Apixaban (Eliquis).   3. HTN Elevated initially, better on recheck. Patient reports it is well controlled at home. No changes today.    Follow up in the AF clinic in 6 months.    Jorja Loa PA-C Afib Clinic [facility] [address] [place], [place] [address] [phone]

## 2023-01-25 ENCOUNTER — Other Ambulatory Visit (HOSPITAL_COMMUNITY): Payer: Self-pay | Admitting: *Deleted

## 2023-01-25 MED ORDER — FLECAINIDE ACETATE 50 MG PO TABS: 50.0000 mg | ORAL_TABLET | Freq: Two times a day (BID) | ORAL | 5 refills | Status: DC

## 2023-03-22 ENCOUNTER — Other Ambulatory Visit (HOSPITAL_COMMUNITY): Payer: Self-pay | Admitting: Physician Assistant

## 2023-05-08 ENCOUNTER — Encounter (HOSPITAL_COMMUNITY): Payer: Self-pay | Admitting: Physician Assistant

## 2023-05-08 ENCOUNTER — Ambulatory Visit (HOSPITAL_COMMUNITY)
Admission: RE | Admit: 2023-05-08 | Discharge: 2023-05-08 | Disposition: A | Discharge status: Home / Self Care | Payer: Medicare Other | Source: Ambulatory Visit | Attending: Physician Assistant | Admitting: Physician Assistant

## 2023-05-08 VITALS — BP 158/82 | HR 64 | Ht 66.0 in | Wt 177.4 lb

## 2023-05-08 DIAGNOSIS — I491 Atrial premature depolarization: Secondary | ICD-10-CM | POA: Diagnosis not present

## 2023-05-08 DIAGNOSIS — D6869 Other thrombophilia: Secondary | ICD-10-CM | POA: Insufficient documentation

## 2023-05-08 DIAGNOSIS — I4892 Unspecified atrial flutter: Secondary | ICD-10-CM | POA: Insufficient documentation

## 2023-05-08 DIAGNOSIS — Z7901 Long term (current) use of anticoagulants: Secondary | ICD-10-CM | POA: Diagnosis not present

## 2023-05-08 DIAGNOSIS — I10 Essential (primary) hypertension: Secondary | ICD-10-CM | POA: Diagnosis not present

## 2023-05-08 DIAGNOSIS — I440 Atrioventricular block, first degree: Secondary | ICD-10-CM | POA: Insufficient documentation

## 2023-05-08 DIAGNOSIS — I4819 Other persistent atrial fibrillation: Secondary | ICD-10-CM | POA: Insufficient documentation

## 2023-05-08 DIAGNOSIS — Z5181 Encounter for therapeutic drug level monitoring: Secondary | ICD-10-CM

## 2023-05-08 DIAGNOSIS — Z79899 Other long term (current) drug therapy: Secondary | ICD-10-CM

## 2023-05-08 DIAGNOSIS — I4891 Unspecified atrial fibrillation: Secondary | ICD-10-CM | POA: Diagnosis present

## 2023-05-08 NOTE — Progress Notes (Signed)
Primary Care Physician: Maximiano Coss, MD Referring Physician:  ER f/u   Elizabeth Sherman is a 82 y.o. female with a h/o new onset afib 01/14/16. This was dx at her PCP, picked up at the exam, pt was unaware. She then decided she would go to the ER for her heart to be evaluated. She was asymptomatic. She was given one eliquis for that evening, rx for cardizem, and was referred here.  She was seen in the afib clinic for the first time, 01/20/16, by then on cardizem and was in afib at 53 bpm,  stated that she feels well and denies any shortness of breath, or fatigue. 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 She does not thinks she snores, no smoking, no alcohol, no large amounts of caffeine.Chadsvasc score of at least 4.  Pt here for f/u, 8/14/`7 and is feeling well. She had one episode of chest pain for which she was evaluated for at [facility] hospital in the ER, 01/23/16, and it had resolved by the time she reached the ER and has not returned. She is still unaware of afib but would like to attempt cardioversion when she has bee on blood thinner x 3 weeks. No issues with the anticoagulant and is taking regularly.  Returns to the afib clinic 02/27/16, f/u cardioversion. This was successful and she remains in SR. She thinks she may see slight improvement in SR but no significant change in her overall energy level.Remains on apixaban.  F/u in a fib clinc 05/28/16. She has been staying in SR. She feels well. Continues on eliquis, no bleeding issues.  F/u in afib clinic 02/22/17 and she is in afib at 92 bpm. She is unaware. She is under extra stress from her daughter being diagnosed with cancer and is coming with her to the Ca center at [facility] 5 days a week, from [place].Marland Kitchen She continues on eliquis.  F/u in the afib clinic, 03/13/17. She continues with afib, rate controlled. She is minimally symptomatic with this but would like to undergo another cardioversion, however,  when to schedule it is up in the air, as her schedule is so full transporting her daughter to radiation. No missed dosed of eliquis.  F/u in afib clinic, 04/18/17, pt had been set up for cardioversion several weeks ago but called and cancelled as she developed an URI. She is now getting over it and is back in the afib clinic to further discuss. I am concerned that now she has been in afib for 2 months and her atrium are severly dilated. I am surprised that she stayed in SR as well as she did since last cardioversion about 2 years ago.  F/u in afib clinic 05/02/17, she has been started on flecainide  50 mg bid, after a low risk stress test. EKG shows no interval change but  remains in afib, rate controlled. She is over her URI and is feeling better from that standpoint. No missed doses of eliquis.  F/u in afib clinic, after successful cardioversion 9/01,but she feels dizzy despite being in SR.Marland Kitchen She called office last week with these c/o and flecainide was reduced to 50 mg bid. Ekg shows SR at 85 bpm, intervals OK. Will try to reduce to 25 mg bid and see if symptoms improve. She refuses a flecainide level today.  F/u in fib clinic, 08/12/17. She is staying in SR. She feels well. She called in after cardioversion c/o  dizziness and flecainide was reduced to 25 mg bid. This has kept her in SR and eliminated her c/o dizziness.  F/u in afib clinic 02/03/18. She is doing well staying in SR on flecainide. Voices no complaints today.  F/u in afib clinic, 12/09/18 and is in SR. She has not noted any afib and is staying in rhythm on flecainide 50 mg bid. No issues with DOAC. She was having some dizziness and her PCP reduced her losartan to 25 mg daily and this resolved the dizziness which reasonable BP control.  F/u in afib clinic, 03/27/19. She feels well. No awareness of afib. Continues on flecainide. No bleeding issues with eliquis.  F/u 09/29/19. She states no awareness of afib. Continues on flecainide. No issues  with bleeding on eliquis with a CHA2DS2VASc score of 4.  F/u in afib clinic, 04/01/20. She is staying in SR on flecainide. No awareness of afib. No issues with bleeding, remains on 5 mg eliquis bid.  CHA2DS2VASc score is  at least 4.  F/u in afib clinic, 08/31/20. She  reports feeling very well. No symptoms of afib. She continues on eliquis with a CHA2DS2VASc score of 4.  F/u in afib clinic, 03/22/21. She continues on flecainide with a CHA2DS2VASc score of 4. No issues with bleeding. No afib to report.   F/u in afib office, 09/12/21,  with afib noted last week at time of eye surgery. Pt is asymptomatic. She would prefer not to have a CV if possible. Discussed increasing rate control first and she is in agreement.   F/u in afib clinic 09/27/21 and she remains in rate controlled afib. No missed anticoagulation. Is compliant with flecainide. She states thjat the afib is not bothering her and she really does not want another cardioversion.   F/u in afib clinic, 10/10/21, as pt called back to clinic several days after last being seen and decided that she did want a cardioversion. She has recently noted more shortness of breath. She is not retaining fluid , as her weight is down 4 lbs. I stopped flecainide on last visit as pt felt she wanted a rate control strategy but she has been back on flecainide 50 mg bid x one week.   F/u in afib clinic, 10/25/21. She had a successful cardioversion and is in SR today. BP elevated on presentation, rechecked with a systolic of 136. She states at home, her BP is usually under 130 systolic.  F/u in afib clinic, 05/01/22. She remains in SR. She has lost around 24 lbs. She has been under stress with the health of her boyfriend and is not eating as much.He is living with her  after his stroke. She remains in SR and on flecainide.   Follow up in the AF clinic 10/31/22. Patient reports that she has done well from an afib standpoint. She did fall and break her hip in February and  had surgery at [facility]. She is walking with a cane. No bleeding issues on anticoagulation.   Follow up in the AF clinic 05/08/23. Patient reports that she has done well since her last visit. She has not had any interim symptoms of afib. She did fall again and break her R elbow, now healed. No bleeding issues on anticoagulation.     Today, she denies symptoms of palpitations, chest pain, shortness of breath, orthopnea, PND, lower extremity edema, dizziness, presyncope, syncope, or neurologic sequela. The patient is tolerating medications without difficulties and is otherwise without complaint today.   Past  Medical History:  Diagnosis Date   A-fib [facility])    Depression    Hyperlipidemia    Hypertension     Current Outpatient Medications  Medication Sig Dispense Refill   apixaban (ELIQUIS) 5 MG TABS tablet Take 1 tablet (5 mg total) by mouth 2 (two) times daily. 60 tablet 6   busPIRone (BUSPAR) 5 MG tablet Take 1 tablet (5 mg total) by mouth 3 (three) times daily as needed. 15 tablet 1   Cholecalciferol (VITAMIN D3) 2000 units TABS Take 2,000 Units by mouth daily.     diltiazem (CARDIZEM CD) 120 MG 24 hr capsule TAKE 1 CAPSULE (120 MG TOTAL) BY MOUTH DAILY. 90 capsule 1   FEROSUL 325 (65 Fe) MG tablet Take 325 mg by mouth daily.     flecainide (TAMBOCOR) 50 MG tablet Take 1 tablet (50 mg total) by mouth 2 (two) times daily. 60 tablet 5   traMADol (ULTRAM) 50 MG tablet Take 50 mg by mouth every 6 (six) hours as needed for severe pain.     No current facility-administered medications for this encounter.    ROS- All systems are reviewed and negative except as per the HPI above  Physical Exam: Vitals:   05/08/23 1320  BP: (!) 158/82  Pulse: 64  Weight: 80.5 kg  Height: 5\' 6"  (1.676 m)     GEN: Well nourished, well developed in no acute distress NECK: No JVD; No carotid bruits CARDIAC: Regular rate and rhythm, no murmurs, rubs, gallops RESPIRATORY:  Clear to  auscultation without rales, wheezing or rhonchi  ABDOMEN: Soft, non-tender, non-distended EXTREMITIES:  No edema; No deformity    EKG today demonstrates SR, 1st degree AV block Vent. rate 64 BPM PR interval 224 ms QRS duration 98 ms QT/QTcB 430/443 ms   Echo--01/2016-  Left ventricle: The cavity size was normal. Wall thickness was   increased in a pattern of mild LVH. Systolic function was normal.   The estimated ejection fraction was in the range of 60% to 65%.   Indeterminant diastolic function (atrial fibrillation). Wall   motion was normal; there were no regional wall motion   abnormalities. - Aortic valve: There was no stenosis. - Mitral valve: There was trivial regurgitation. - Left atrium: The atrium was severely dilated, 41 mm, diameter, LA volume 103 ml - Right ventricle: The cavity size was normal. Systolic function   was normal. - Right atrium: The atrium was severely dilated. - Pulmonary arteries: No complete TR doppler jet so unable to   estimate PA systolic pressure. - Inferior vena cava: The vessel was normal in size. The   respirophasic diameter changes were in the normal range (>= 50%),   consistent with normal central venous pressure. - Pericardium, extracardiac: Prominent epicardial adipose tissue.   Impressions:   - The patient was in atrial fibrillation. Normal LV size with mild   LV hypertrophy. EF 60-65%. Normal RV size and systolic function.   No significant valvular abnormalities. Severe biatrial   enlargement.   CHA2DS2-VASc Score = 4  The patient's score is based upon: CHF History: 0 HTN History: 1 Diabetes History: 0 Stroke History: 0 Vascular Disease History: 0 Age Score: 2 Gender Score: 1       ASSESSMENT AND PLAN: Persistent Atrial Fibrillation/atrial flutter The patient's CHA2DS2-VASc score is 4, indicating a 4.8% annual risk of stroke.   Patient appears to be maintaining SR Continue flecainide 50 mg BID Continue diltiazem 120  mg daily  Continue Eliquis 5 mg BID  Secondary Hypercoagulable State (ICD10:  D68.69) The patient is at significant risk for stroke/thromboembolism based upon her CHA2DS2-VASc Score of 4.  Continue Apixaban (Eliquis).   HTN Elevated today, well controlled at home. Patient reports h/o white coat HTN No changes today   Follow up in the AF clinic in 6 months.    Jorja Loa PA-C Afib Clinic [facility] [address] [place], [place] [address] [phone]

## 2023-07-01 ENCOUNTER — Other Ambulatory Visit (HOSPITAL_COMMUNITY): Payer: Self-pay | Admitting: *Deleted

## 2023-07-01 MED ORDER — APIXABAN 5 MG PO TABS: 5.0000 mg | ORAL_TABLET | Freq: Two times a day (BID) | ORAL | 6 refills | Status: DC

## 2023-07-02 ENCOUNTER — Other Ambulatory Visit (HOSPITAL_COMMUNITY): Payer: Self-pay

## 2023-07-02 MED ORDER — FLECAINIDE ACETATE 50 MG PO TABS: 50.0000 mg | ORAL_TABLET | Freq: Two times a day (BID) | ORAL | 1 refills | Status: DC

## 2023-07-02 MED ORDER — APIXABAN 5 MG PO TABS: 5.0000 mg | ORAL_TABLET | Freq: Two times a day (BID) | ORAL | 1 refills | Status: DC

## 2023-07-02 MED ORDER — DILTIAZEM HCL ER COATED BEADS 120 MG PO CP24: 120.0000 mg | ORAL_CAPSULE | Freq: Every day | ORAL | 1 refills | Status: DC

## 2023-08-07 ENCOUNTER — Other Ambulatory Visit (HOSPITAL_COMMUNITY): Payer: Self-pay

## 2023-08-07 MED ORDER — APIXABAN 5 MG PO TABS: 5.0000 mg | ORAL_TABLET | Freq: Two times a day (BID) | ORAL | Status: DC

## 2023-08-09 ENCOUNTER — Other Ambulatory Visit (HOSPITAL_COMMUNITY): Payer: Self-pay

## 2023-08-09 MED ORDER — APIXABAN 5 MG PO TABS: 5.0000 mg | ORAL_TABLET | Freq: Two times a day (BID) | ORAL | Status: DC

## 2023-09-18 ENCOUNTER — Ambulatory Visit (HOSPITAL_COMMUNITY): Payer: PRIVATE HEALTH INSURANCE | Admitting: Physician Assistant

## 2023-10-01 ENCOUNTER — Encounter (HOSPITAL_COMMUNITY): Payer: Self-pay | Admitting: Physician Assistant

## 2023-10-01 ENCOUNTER — Ambulatory Visit (HOSPITAL_COMMUNITY)
Admission: RE | Admit: 2023-10-01 | Discharge: 2023-10-01 | Disposition: A | Discharge status: Home / Self Care | Payer: PRIVATE HEALTH INSURANCE | Source: Ambulatory Visit | Attending: Physician Assistant | Admitting: Physician Assistant

## 2023-10-01 VITALS — BP 148/92 | HR 88 | Ht 66.0 in | Wt 182.6 lb

## 2023-10-01 DIAGNOSIS — I4819 Other persistent atrial fibrillation: Secondary | ICD-10-CM | POA: Insufficient documentation

## 2023-10-01 DIAGNOSIS — Z79899 Other long term (current) drug therapy: Secondary | ICD-10-CM | POA: Diagnosis not present

## 2023-10-01 DIAGNOSIS — D6869 Other thrombophilia: Secondary | ICD-10-CM | POA: Diagnosis not present

## 2023-10-01 DIAGNOSIS — I10 Essential (primary) hypertension: Secondary | ICD-10-CM | POA: Insufficient documentation

## 2023-10-01 DIAGNOSIS — I4892 Unspecified atrial flutter: Secondary | ICD-10-CM | POA: Insufficient documentation

## 2023-10-01 DIAGNOSIS — Z7901 Long term (current) use of anticoagulants: Secondary | ICD-10-CM | POA: Diagnosis not present

## 2023-10-01 DIAGNOSIS — Z5181 Encounter for therapeutic drug level monitoring: Secondary | ICD-10-CM | POA: Diagnosis not present

## 2023-10-01 NOTE — Progress Notes (Signed)
 Primary Care Physician: Maximiano Coss, MD Referring Physician:  ER f/u   Elizabeth Sherman is a 83 y.o. female with a h/o new onset afib 01/14/16. This was dx at her PCP, picked up at the exam, pt was unaware. She then decided she would go to the ER for her heart to be evaluated. She was asymptomatic. She was given one eliquis for that evening, rx for cardizem, and was referred here.  She was seen in the afib clinic for the first time, 01/20/16, by then on cardizem and was in afib at 53 bpm,  stated that she feels well and denies any shortness of breath, or fatigue. 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 She does not thinks she snores, no smoking, no alcohol, no large amounts of caffeine.Chadsvasc score of at least 4.  Pt here for f/u, 8/14/`7 and is feeling well. She had one episode of chest pain for which she was evaluated for at [facility] hospital in the ER, 01/23/16, and it had resolved by the time she reached the ER and has not returned. She is still unaware of afib but would like to attempt cardioversion when she has bee on blood thinner x 3 weeks. No issues with the anticoagulant and is taking regularly.  Returns to the afib clinic 02/27/16, f/u cardioversion. This was successful and she remains in SR. She thinks she may see slight improvement in SR but no significant change in her overall energy level.Remains on apixaban.  F/u in a fib clinc 05/28/16. She has been staying in SR. She feels well. Continues on eliquis, no bleeding issues.  F/u in afib clinic 02/22/17 and she is in afib at 92 bpm. She is unaware. She is under extra stress from her daughter being diagnosed with cancer and is coming with her to the Ca center at [place] 5 days a week, from [place].Marland Kitchen She continues on eliquis.  F/u in the afib clinic, 03/13/17. She continues with afib, rate controlled. She is minimally symptomatic with this but would like to undergo another cardioversion, however,  when to schedule it is up in the air, as her schedule is so full transporting her daughter to radiation. No missed dosed of eliquis.  F/u in afib clinic, 04/18/17, pt had been set up for cardioversion several weeks ago but called and cancelled as she developed an URI. She is now getting over it and is back in the afib clinic to further discuss. I am concerned that now she has been in afib for 2 months and her atrium are severly dilated. I am surprised that she stayed in SR as well as she did since last cardioversion about 2 years ago.  F/u in afib clinic 05/02/17, she has been started on flecainide  50 mg bid, after a low risk stress test. EKG shows no interval change but  remains in afib, rate controlled. She is over her URI and is feeling better from that standpoint. No missed doses of eliquis.  F/u in afib clinic, after successful cardioversion 9/01,but she feels dizzy despite being in SR.Marland Kitchen She called office last week with these c/o and flecainide was reduced to 50 mg bid. Ekg shows SR at 85 bpm, intervals OK. Will try to reduce to 25 mg bid and see if symptoms improve. She refuses a flecainide level today.  F/u in fib clinic, 08/12/17. She is staying in SR. She feels well. She called in after cardioversion c/o  dizziness and flecainide was reduced to 25 mg bid. This has kept her in SR and eliminated her c/o dizziness.  F/u in afib clinic 02/03/18. She is doing well staying in SR on flecainide. Voices no complaints today.  F/u in afib clinic, 12/09/18 and is in SR. She has not noted any afib and is staying in rhythm on flecainide 50 mg bid. No issues with DOAC. She was having some dizziness and her PCP reduced her losartan to 25 mg daily and this resolved the dizziness which reasonable BP control.  F/u in afib clinic, 03/27/19. She feels well. No awareness of afib. Continues on flecainide. No bleeding issues with eliquis.  F/u 09/29/19. She states no awareness of afib. Continues on flecainide. No issues  with bleeding on eliquis with a CHA2DS2VASc score of 4.  F/u in afib clinic, 04/01/20. She is staying in SR on flecainide. No awareness of afib. No issues with bleeding, remains on 5 mg eliquis bid.  CHA2DS2VASc score is  at least 4.  F/u in afib clinic, 08/31/20. She  reports feeling very well. No symptoms of afib. She continues on eliquis with a CHA2DS2VASc score of 4.  F/u in afib clinic, 03/22/21. She continues on flecainide with a CHA2DS2VASc score of 4. No issues with bleeding. No afib to report.   F/u in afib office, 09/12/21,  with afib noted last week at time of eye surgery. Pt is asymptomatic. She would prefer not to have a CV if possible. Discussed increasing rate control first and she is in agreement.   F/u in afib clinic 09/27/21 and she remains in rate controlled afib. No missed anticoagulation. Is compliant with flecainide. She states thjat the afib is not bothering her and she really does not want another cardioversion.   F/u in afib clinic, 10/10/21, as pt called back to clinic several days after last being seen and decided that she did want a cardioversion. She has recently noted more shortness of breath. She is not retaining fluid , as her weight is down 4 lbs. I stopped flecainide on last visit as pt felt she wanted a rate control strategy but she has been back on flecainide 50 mg bid x one week.   F/u in afib clinic, 10/25/21. She had a successful cardioversion and is in SR today. BP elevated on presentation, rechecked with a systolic of 136. She states at home, her BP is usually under 130 systolic.  F/u in afib clinic, 05/01/22. She remains in SR. She has lost around 24 lbs. She has been under stress with the health of her boyfriend and is not eating as much.He is living with her  after his stroke. She remains in SR and on flecainide.   Follow up in the AF clinic 10/31/22. Patient reports that she has done well from an afib standpoint. She did fall and break her hip in February and  had surgery at [facility]. She is walking with a cane. No bleeding issues on anticoagulation.   Follow up in the AF clinic 05/08/23. Patient reports that she has done well since her last visit. She has not had any interim symptoms of afib. She did fall again and break her R elbow, now healed. No bleeding issues on anticoagulation.   Follow up 10/01/23. Patient returns for follow up for atrial fibrillation and flecainide monitoring. She saw her PCP about two weeks ago and was found to have an irregular rhythm. In hindsight, patient has felt more fatigued for 3-4 weeks.  She is in afib today. There were no specific triggers that she could identify.    Today, she  denies symptoms of palpitations, chest pain, shortness of breath, orthopnea, PND, lower extremity edema, dizziness, presyncope, syncope, snoring, daytime somnolence, bleeding, or neurologic sequela. The patient is tolerating medications without difficulties and is otherwise without complaint today.    Past Medical History:  Diagnosis Date   A-fib (HCC)    Depression    Hyperlipidemia    Hypertension     Current Outpatient Medications  Medication Sig Dispense Refill   ALPRAZolam (XANAX) 0.25 MG tablet Take 0.25 mg by mouth 3 (three) times daily as needed.     apixaban (ELIQUIS) 5 MG TABS tablet Take 1 tablet (5 mg total) by mouth 2 (two) times daily. 56 tablet    busPIRone (BUSPAR) 5 MG tablet Take 1 tablet (5 mg total) by mouth 3 (three) times daily as needed. 15 tablet 1   Cholecalciferol (VITAMIN D3) 2000 units TABS Take 2,000 Units by mouth daily.     diltiazem (CARDIZEM CD) 120 MG 24 hr capsule Take 1 capsule (120 mg total) by mouth daily. 90 capsule 1   FEROSUL 325 (65 Fe) MG tablet Take 325 mg by mouth daily.     flecainide (TAMBOCOR) 50 MG tablet Take 1 tablet (50 mg total) by mouth 2 (two) times daily. 180 tablet 1   hydrochlorothiazide (HYDRODIURIL) 25 MG tablet Take 25 mg by mouth every morning.     traMADol  (ULTRAM) 50 MG tablet Take 50 mg by mouth every 6 (six) hours as needed for severe pain.     No current facility-administered medications for this encounter.    ROS- All systems are reviewed and negative except as per the HPI above  Physical Exam: Vitals:   10/01/23 1321  BP: (!) 148/92  Pulse: 88  Weight: 82.8 kg  Height: 5\' 6"  (1.676 m)    GEN: Well nourished, well developed in no acute distress CARDIAC: Irregularly irregular rate and rhythm, no murmurs, rubs, gallops RESPIRATORY:  Clear to auscultation without rales, wheezing or rhonchi  ABDOMEN: Soft, non-tender, non-distended EXTREMITIES:  No edema; No deformity     EKG today demonstrates Afib Vent. rate 88 BPM PR interval * ms QRS duration 100 ms QT/QTcB 396/479 ms   Echo--01/2016-  Left ventricle: The cavity size was normal. Wall thickness was   increased in a pattern of mild LVH. Systolic function was normal.   The estimated ejection fraction was in the range of 60% to 65%.   Indeterminant diastolic function (atrial fibrillation). Wall   motion was normal; there were no regional wall motion   abnormalities. - Aortic valve: There was no stenosis. - Mitral valve: There was trivial regurgitation. - Left atrium: The atrium was severely dilated, 41 mm, diameter, LA volume 103 ml - Right ventricle: The cavity size was normal. Systolic function   was normal. - Right atrium: The atrium was severely dilated. - Pulmonary arteries: No complete TR doppler jet so unable to   estimate PA systolic pressure. - Inferior vena cava: The vessel was normal in size. The   respirophasic diameter changes were in the normal range (>= 50%),   consistent with normal central venous pressure. - Pericardium, extracardiac: Prominent epicardial adipose tissue.   Impressions:   - The patient was in atrial fibrillation. Normal LV size with mild   LV hypertrophy. EF 60-65%. Normal RV size and systolic function.   No significant valvular  abnormalities. Severe biatrial  enlargement.    CHA2DS2-VASc Score = 4  The patient's score is based upon: CHF History: 0 HTN History: 1 Diabetes History: 0 Stroke History: 0 Vascular Disease History: 0 Age Score: 2 Gender Score: 1       ASSESSMENT AND PLAN: Persistent Atrial Fibrillation/atrial flutter The patient's CHA2DS2-VASc score is 4, indicating a 4.8% annual risk of stroke.   Patient back in persistent afib with symptoms of fatigue. We discussed rhythm control options today. Will arrange for DCCV. She denies any missed doses of anticoagulation. Check bmet/cbc today.  Continue flecainide 50 mg BID Continue diltiazem 120 mg daily Continue Eliquis 5 mg BID  Secondary Hypercoagulable State (ICD10:  D68.69) The patient is at significant risk for stroke/thromboembolism based upon her CHA2DS2-VASc Score of 4.  Continue Apixaban (Eliquis). No bleeding issues.   HTN Borderline elevated today Will reassess in SR Has h/o white coat HTN    Follow up in the AF clinic post DCCV.    Informed Consent   Shared Decision Making/Informed Consent The risks (stroke, cardiac arrhythmias rarely resulting in the need for a temporary or permanent pacemaker, skin irritation or burns and complications associated with conscious sedation including aspiration, arrhythmia, respiratory failure and death), benefits (restoration of normal sinus rhythm) and alternatives of a direct current cardioversion were explained in detail to Ms. Maute and she agrees to proceed.         Myrtha Ates PA-C [facility] [address] [place], [place] [address] [phone]

## 2023-10-01 NOTE — Patient Instructions (Addendum)
 Cardioversion scheduled for: April 18th 2025   - Arrive at the [employer] [facility] of [facility] ([address]  and check in with ADMITTING at 9:30 AM   - Do not eat or drink anything after midnight the night prior to your procedure.   - Take all your morning medication (except diabetic medications) with a sip of water prior to arrival.  - You will not be able to drive home after your procedure. Please ensure you have a responsible adult to drive you home. You will need someone with you for 24 hours post procedure.    - Do NOT miss any doses of your blood thinner - if you should miss a dose or take a dose more than 4 hours late -- please notify our office immediately.    - Expect to be in the procedural area approximately 2 hours.   - If you feel as if you go back into normal rhythm prior to scheduled cardioversion, please notify our office immediately.   If your procedure is canceled in the cardioversion suite you will be charged a cancellation fee.      For those patients who have a scheduled procedure/anesthesia on the same day of the week as their dose, hold the medication on the day of surgery.  They can take their scheduled dose the week before.  **Patients on the above medications scheduled for elective procedures that have not held the medication for the appropriate amount of time are at risk of cancellation or change in the anesthetic plan.

## 2023-10-01 NOTE — H&P (View-Only) (Signed)
 Primary Care Physician: Maximiano Coss, MD Referring Physician:  ER f/u   Elizabeth Sherman is a 83 y.o. female with a h/o new onset afib 01/14/16. This was dx at her PCP, picked up at the exam, pt was unaware. She then decided she would go to the ER for her heart to be evaluated. She was asymptomatic. She was given one eliquis for that evening, rx for cardizem, and was referred here.  She was seen in the afib clinic for the first time, 01/20/16, by then on cardizem and was in afib at 53 bpm,  stated that she feels well and denies any shortness of breath, or fatigue. 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 She does not thinks she snores, no smoking, no alcohol, no large amounts of caffeine.Chadsvasc score of at least 4.  Pt here for f/u, 8/14/`7 and is feeling well. She had one episode of chest pain for which she was evaluated for at [facility] hospital in the ER, 01/23/16, and it had resolved by the time she reached the ER and has not returned. She is still unaware of afib but would like to attempt cardioversion when she has bee on blood thinner x 3 weeks. No issues with the anticoagulant and is taking regularly.  Returns to the afib clinic 02/27/16, f/u cardioversion. This was successful and she remains in SR. She thinks she may see slight improvement in SR but no significant change in her overall energy level.Remains on apixaban.  F/u in a fib clinc 05/28/16. She has been staying in SR. She feels well. Continues on eliquis, no bleeding issues.  F/u in afib clinic 02/22/17 and she is in afib at 92 bpm. She is unaware. She is under extra stress from her daughter being diagnosed with cancer and is coming with her to the Ca center at [facility] 5 days a week, from [place].Marland Kitchen She continues on eliquis.  F/u in the afib clinic, 03/13/17. She continues with afib, rate controlled. She is minimally symptomatic with this but would like to undergo another cardioversion, however,  when to schedule it is up in the air, as her schedule is so full transporting her daughter to radiation. No missed dosed of eliquis.  F/u in afib clinic, 04/18/17, pt had been set up for cardioversion several weeks ago but called and cancelled as she developed an URI. She is now getting over it and is back in the afib clinic to further discuss. I am concerned that now she has been in afib for 2 months and her atrium are severly dilated. I am surprised that she stayed in SR as well as she did since last cardioversion about 2 years ago.  F/u in afib clinic 05/02/17, she has been started on flecainide  50 mg bid, after a low risk stress test. EKG shows no interval change but  remains in afib, rate controlled. She is over her URI and is feeling better from that standpoint. No missed doses of eliquis.  F/u in afib clinic, after successful cardioversion 9/01,but she feels dizzy despite being in SR.Marland Kitchen She called office last week with these c/o and flecainide was reduced to 50 mg bid. Ekg shows SR at 85 bpm, intervals OK. Will try to reduce to 25 mg bid and see if symptoms improve. She refuses a flecainide level today.  F/u in fib clinic, 08/12/17. She is staying in SR. She feels well. She called in after cardioversion c/o  dizziness and flecainide was reduced to 25 mg bid. This has kept her in SR and eliminated her c/o dizziness.  F/u in afib clinic 02/03/18. She is doing well staying in SR on flecainide. Voices no complaints today.  F/u in afib clinic, 12/09/18 and is in SR. She has not noted any afib and is staying in rhythm on flecainide 50 mg bid. No issues with DOAC. She was having some dizziness and her PCP reduced her losartan to 25 mg daily and this resolved the dizziness which reasonable BP control.  F/u in afib clinic, 03/27/19. She feels well. No awareness of afib. Continues on flecainide. No bleeding issues with eliquis.  F/u 09/29/19. She states no awareness of afib. Continues on flecainide. No issues  with bleeding on eliquis with a CHA2DS2VASc score of 4.  F/u in afib clinic, 04/01/20. She is staying in SR on flecainide. No awareness of afib. No issues with bleeding, remains on 5 mg eliquis bid.  CHA2DS2VASc score is  at least 4.  F/u in afib clinic, 08/31/20. She  reports feeling very well. No symptoms of afib. She continues on eliquis with a CHA2DS2VASc score of 4.  F/u in afib clinic, 03/22/21. She continues on flecainide with a CHA2DS2VASc score of 4. No issues with bleeding. No afib to report.   F/u in afib office, 09/12/21,  with afib noted last week at time of eye surgery. Pt is asymptomatic. She would prefer not to have a CV if possible. Discussed increasing rate control first and she is in agreement.   F/u in afib clinic 09/27/21 and she remains in rate controlled afib. No missed anticoagulation. Is compliant with flecainide. She states thjat the afib is not bothering her and she really does not want another cardioversion.   F/u in afib clinic, 10/10/21, as pt called back to clinic several days after last being seen and decided that she did want a cardioversion. She has recently noted more shortness of breath. She is not retaining fluid , as her weight is down 4 lbs. I stopped flecainide on last visit as pt felt she wanted a rate control strategy but she has been back on flecainide 50 mg bid x one week.   F/u in afib clinic, 10/25/21. She had a successful cardioversion and is in SR today. BP elevated on presentation, rechecked with a systolic of 136. She states at home, her BP is usually under 130 systolic.  F/u in afib clinic, 05/01/22. She remains in SR. She has lost around 24 lbs. She has been under stress with the health of her boyfriend and is not eating as much.He is living with her  after his stroke. She remains in SR and on flecainide.   Follow up in the AF clinic 10/31/22. Patient reports that she has done well from an afib standpoint. She did fall and break her hip in February and  had surgery at [facility]. She is walking with a cane. No bleeding issues on anticoagulation.   Follow up in the AF clinic 05/08/23. Patient reports that she has done well since her last visit. She has not had any interim symptoms of afib. She did fall again and break her R elbow, now healed. No bleeding issues on anticoagulation.   Follow up 10/01/23. Patient returns for follow up for atrial fibrillation and flecainide monitoring. She saw her PCP about two weeks ago and was found to have an irregular rhythm. In hindsight, patient has felt more fatigued for 3-4 weeks.  She is in afib today. There were no specific triggers that she could identify.    Today, she  denies symptoms of palpitations, chest pain, shortness of breath, orthopnea, PND, lower extremity edema, dizziness, presyncope, syncope, snoring, daytime somnolence, bleeding, or neurologic sequela. The patient is tolerating medications without difficulties and is otherwise without complaint today.    Past Medical History:  Diagnosis Date   A-fib (HCC)    Depression    Hyperlipidemia    Hypertension     Current Outpatient Medications  Medication Sig Dispense Refill   ALPRAZolam (XANAX) 0.25 MG tablet Take 0.25 mg by mouth 3 (three) times daily as needed.     apixaban (ELIQUIS) 5 MG TABS tablet Take 1 tablet (5 mg total) by mouth 2 (two) times daily. 56 tablet    busPIRone (BUSPAR) 5 MG tablet Take 1 tablet (5 mg total) by mouth 3 (three) times daily as needed. 15 tablet 1   Cholecalciferol (VITAMIN D3) 2000 units TABS Take 2,000 Units by mouth daily.     diltiazem (CARDIZEM CD) 120 MG 24 hr capsule Take 1 capsule (120 mg total) by mouth daily. 90 capsule 1   FEROSUL 325 (65 Fe) MG tablet Take 325 mg by mouth daily.     flecainide (TAMBOCOR) 50 MG tablet Take 1 tablet (50 mg total) by mouth 2 (two) times daily. 180 tablet 1   hydrochlorothiazide (HYDRODIURIL) 25 MG tablet Take 25 mg by mouth every morning.     traMADol  (ULTRAM) 50 MG tablet Take 50 mg by mouth every 6 (six) hours as needed for severe pain.     No current facility-administered medications for this encounter.    ROS- All systems are reviewed and negative except as per the HPI above  Physical Exam: Vitals:   10/01/23 1321  BP: (!) 148/92  Pulse: 88  Weight: 82.8 kg  Height: 5\' 6"  (1.676 m)    GEN: Well nourished, well developed in no acute distress CARDIAC: Irregularly irregular rate and rhythm, no murmurs, rubs, gallops RESPIRATORY:  Clear to auscultation without rales, wheezing or rhonchi  ABDOMEN: Soft, non-tender, non-distended EXTREMITIES:  No edema; No deformity     EKG today demonstrates Afib Vent. rate 88 BPM PR interval * ms QRS duration 100 ms QT/QTcB 396/479 ms   Echo--01/2016-  Left ventricle: The cavity size was normal. Wall thickness was   increased in a pattern of mild LVH. Systolic function was normal.   The estimated ejection fraction was in the range of 60% to 65%.   Indeterminant diastolic function (atrial fibrillation). Wall   motion was normal; there were no regional wall motion   abnormalities. - Aortic valve: There was no stenosis. - Mitral valve: There was trivial regurgitation. - Left atrium: The atrium was severely dilated, 41 mm, diameter, LA volume 103 ml - Right ventricle: The cavity size was normal. Systolic function   was normal. - Right atrium: The atrium was severely dilated. - Pulmonary arteries: No complete TR doppler jet so unable to   estimate PA systolic pressure. - Inferior vena cava: The vessel was normal in size. The   respirophasic diameter changes were in the normal range (>= 50%),   consistent with normal central venous pressure. - Pericardium, extracardiac: Prominent epicardial adipose tissue.   Impressions:   - The patient was in atrial fibrillation. Normal LV size with mild   LV hypertrophy. EF 60-65%. Normal RV size and systolic function.   No significant valvular  abnormalities. Severe biatrial  enlargement.    CHA2DS2-VASc Score = 4  The patient's score is based upon: CHF History: 0 HTN History: 1 Diabetes History: 0 Stroke History: 0 Vascular Disease History: 0 Age Score: 2 Gender Score: 1       ASSESSMENT AND PLAN: Persistent Atrial Fibrillation/atrial flutter The patient's CHA2DS2-VASc score is 4, indicating a 4.8% annual risk of stroke.   Patient back in persistent afib with symptoms of fatigue. We discussed rhythm control options today. Will arrange for DCCV. She denies any missed doses of anticoagulation. Check bmet/cbc today.  Continue flecainide 50 mg BID Continue diltiazem 120 mg daily Continue Eliquis 5 mg BID  Secondary Hypercoagulable State (ICD10:  D68.69) The patient is at significant risk for stroke/thromboembolism based upon her CHA2DS2-VASc Score of 4.  Continue Apixaban (Eliquis). No bleeding issues.   HTN Borderline elevated today Will reassess in SR Has h/o white coat HTN    Follow up in the AF clinic post DCCV.    Informed Consent   Shared Decision Making/Informed Consent The risks (stroke, cardiac arrhythmias rarely resulting in the need for a temporary or permanent pacemaker, skin irritation or burns and complications associated with conscious sedation including aspiration, arrhythmia, respiratory failure and death), benefits (restoration of normal sinus rhythm) and alternatives of a direct current cardioversion were explained in detail to Ms. Maute and she agrees to proceed.         Myrtha Ates PA-C [facility] [address] [place], [place] [address] [phone]

## 2023-10-02 ENCOUNTER — Telehealth (HOSPITAL_COMMUNITY): Payer: Self-pay

## 2023-10-02 ENCOUNTER — Other Ambulatory Visit (HOSPITAL_COMMUNITY): Payer: Self-pay | Admitting: *Deleted

## 2023-10-02 DIAGNOSIS — I4819 Other persistent atrial fibrillation: Secondary | ICD-10-CM

## 2023-10-02 NOTE — Telephone Encounter (Signed)
 Patient recently had blood work performed on April 2nd at her pcp. Lab results are loaded under media - Amb Correspondence.

## 2023-10-03 NOTE — Progress Notes (Signed)
 Spoke to patient and instructed them to come at 0915  and to be NPO after 0000.  Medications reviewed.    Confirmed that patient will have a ride home and someone to stay with them for 24 hours after the procedure.

## 2023-10-04 ENCOUNTER — Other Ambulatory Visit: Payer: Self-pay

## 2023-10-04 ENCOUNTER — Ambulatory Visit (HOSPITAL_COMMUNITY)
Admission: RE | Admit: 2023-10-04 | Discharge: 2023-10-04 | Disposition: A | Discharge status: Home / Self Care | Attending: Internal Medicine | Admitting: Internal Medicine

## 2023-10-04 ENCOUNTER — Ambulatory Visit (HOSPITAL_BASED_OUTPATIENT_CLINIC_OR_DEPARTMENT_OTHER): Admitting: Anesthesiology

## 2023-10-04 ENCOUNTER — Ambulatory Visit (HOSPITAL_COMMUNITY): Admitting: Anesthesiology

## 2023-10-04 ENCOUNTER — Encounter (HOSPITAL_COMMUNITY)
Admission: RE | Disposition: A | Discharge status: Home / Self Care | Payer: Self-pay | Source: Home / Self Care | Attending: Internal Medicine

## 2023-10-04 DIAGNOSIS — I10 Essential (primary) hypertension: Secondary | ICD-10-CM

## 2023-10-04 DIAGNOSIS — Z7901 Long term (current) use of anticoagulants: Secondary | ICD-10-CM | POA: Diagnosis not present

## 2023-10-04 DIAGNOSIS — I4892 Unspecified atrial flutter: Secondary | ICD-10-CM

## 2023-10-04 DIAGNOSIS — I4891 Unspecified atrial fibrillation: Secondary | ICD-10-CM | POA: Diagnosis not present

## 2023-10-04 DIAGNOSIS — I4819 Other persistent atrial fibrillation: Secondary | ICD-10-CM | POA: Insufficient documentation

## 2023-10-04 DIAGNOSIS — D6869 Other thrombophilia: Secondary | ICD-10-CM | POA: Diagnosis not present

## 2023-10-04 DIAGNOSIS — F329 Major depressive disorder, single episode, unspecified: Secondary | ICD-10-CM | POA: Diagnosis not present

## 2023-10-04 DIAGNOSIS — Z79899 Other long term (current) drug therapy: Secondary | ICD-10-CM | POA: Insufficient documentation

## 2023-10-04 HISTORY — PX: CARDIOVERSION: EP1203

## 2023-10-04 LAB — POCT I-STAT, CHEM 8
BUN: 17 mg/dL (ref 8–23)
Calcium, Ion: 1.2 mmol/L (ref 1.15–1.40)
Chloride: 101 mmol/L (ref 98–111)
Creatinine, Ser: 1.2 mg/dL — ABNORMAL HIGH (ref 0.44–1.00)
Glucose, Bld: 110 mg/dL — ABNORMAL HIGH (ref 70–99)
HCT: 44 % (ref 36.0–46.0)
Hemoglobin: 15 g/dL (ref 12.0–15.0)
Potassium: 3.9 mmol/L (ref 3.5–5.1)
Sodium: 138 mmol/L (ref 135–145)
TCO2: 25 mmol/L (ref 22–32)

## 2023-10-04 SURGERY — CARDIOVERSION (CATH LAB)
Anesthesia: General

## 2023-10-04 MED ORDER — SODIUM CHLORIDE 0.9% FLUSH: 3.0000 mL | INTRAVENOUS | Status: DC | PRN

## 2023-10-04 MED ORDER — SODIUM CHLORIDE 0.9% FLUSH
3.0000 mL | Freq: Two times a day (BID) | INTRAVENOUS | Status: DC
Start: 2023-10-04 — End: 2023-10-04

## 2023-10-04 MED ORDER — PROPOFOL 10 MG/ML IV BOLUS
INTRAVENOUS | Status: DC | PRN
  Administered 2023-10-04: 40 mg via INTRAVENOUS

## 2023-10-04 MED ORDER — LIDOCAINE 2% (20 MG/ML) 5 ML SYRINGE
INTRAMUSCULAR | Status: DC | PRN
  Administered 2023-10-04: 100 mg via INTRAVENOUS

## 2023-10-04 SURGICAL SUPPLY — 1 items: PAD DEFIB RADIO PHYSIO CONN (PAD) ×1 IMPLANT

## 2023-10-04 NOTE — CV Procedure (Signed)
 Procedure: Electrical Cardioversion Indications:  Atrial Fibrillation  Procedure Details:  Consent: Risks of procedure as well as the alternatives and risks of each were explained to the (patient/caregiver).  Consent for procedure obtained.  Time Ou

## 2023-10-04 NOTE — Discharge Instructions (Signed)
 Electrical Cardioversion Electrical cardioversion is the delivery of a jolt of electricity to restore a normal rhythm to the heart. A rhythm that is too fast or is not regular keeps the heart from pumping well. In this procedure, sticky patches or metal p

## 2023-10-04 NOTE — Interval H&P Note (Signed)
 History and Physical Interval Note:  10/04/2023 10:02 AM  Elizabeth Sherman  has presented today for surgery, with the diagnosis of persistent a fib.  The various methods of treatment have been discussed with the patient and family. After consideration of risks, benefits and other options for treatment, the patient has consented to  Procedure(s): CARDIOVERSION (N/A) as a surgical intervention.  The patient's history has been reviewed, patient examined, no change in status, stable for surgery.  I have reviewed the patient's chart and labs.  Questions were answered to the patient's satisfaction.     Nari Vannatter A Demetrica Zipp

## 2023-10-04 NOTE — Anesthesia Postprocedure Evaluation (Signed)
 Anesthesia Post Note  Patient: Elizabeth Sherman  Procedure(s) Performed: CARDIOVERSION     Patient location during evaluation: PACU Anesthesia Type: General Level of consciousness: awake and alert Pain management: pain level controlled Vital Signs Assessment: post-procedure vital signs reviewed and stable Respiratory status: spontaneous breathing, nonlabored ventilation, respiratory function stable and patient connected to nasal cannula oxygen Cardiovascular status: blood pressure returned to baseline and stable Postop Assessment: no apparent nausea or vomiting Anesthetic complications: no   No notable events documented.  Last Vitals:  Vitals:   10/04/23 1051 10/04/23 1101  BP: 132/70 (!) 141/67  Pulse: 87 89  Resp: 14 14  Temp:    SpO2: 97% 98%    Last Pain:  Vitals:   10/04/23 1109  TempSrc:   PainSc: 0-No pain                 Leslye Rast

## 2023-10-04 NOTE — Transfer of Care (Signed)
 Immediate Anesthesia Transfer of Care Note  Patient: Elizabeth Sherman  Procedure(s) Performed: CARDIOVERSION  Patient Location: PACU  Anesthesia Type:MAC  Level of Consciousness: awake and alert   Airway & Oxygen Therapy: Patient Spontanous Breathing  Post-op Assessment: Report given to RN  Post vital signs: Reviewed and stable  Last Vitals:  Vitals Value Taken Time  BP 157/75 10/04/23 1021  Temp    Pulse 83 10/04/23 1022  Resp 29 10/04/23 1023  SpO2 99 % 10/04/23 1022  Vitals shown include unfiled device data.  Last Pain:  Vitals:   10/04/23 0930  TempSrc:   PainSc: 0-No pain         Complications: No notable events documented.

## 2023-10-04 NOTE — Anesthesia Preprocedure Evaluation (Addendum)
 Anesthesia Evaluation  Patient identified by MRN, date of birth, ID band Patient awake    Reviewed: Allergy & Precautions, NPO status , Patient's Chart, lab work & pertinent test results, reviewed documented beta bl

## 2023-10-06 ENCOUNTER — Encounter (HOSPITAL_COMMUNITY): Payer: Self-pay | Admitting: Internal Medicine

## 2023-10-18 ENCOUNTER — Ambulatory Visit (HOSPITAL_COMMUNITY)
Admission: RE | Admit: 2023-10-18 | Discharge: 2023-10-18 | Disposition: A | Discharge status: Home / Self Care | Payer: PRIVATE HEALTH INSURANCE | Source: Ambulatory Visit | Attending: Physician Assistant | Admitting: Physician Assistant

## 2023-10-18 ENCOUNTER — Encounter (HOSPITAL_COMMUNITY): Payer: Self-pay | Admitting: Physician Assistant

## 2023-10-18 VITALS — BP 138/72 | HR 78 | Ht 66.0 in | Wt 184.2 lb

## 2023-10-18 DIAGNOSIS — Z79899 Other long term (current) drug therapy: Secondary | ICD-10-CM | POA: Diagnosis present

## 2023-10-18 DIAGNOSIS — D6869 Other thrombophilia: Secondary | ICD-10-CM | POA: Diagnosis not present

## 2023-10-18 DIAGNOSIS — I4819 Other persistent atrial fibrillation: Secondary | ICD-10-CM | POA: Insufficient documentation

## 2023-10-18 DIAGNOSIS — Z5181 Encounter for therapeutic drug level monitoring: Secondary | ICD-10-CM | POA: Diagnosis present

## 2023-10-18 MED ORDER — AMIODARONE HCL 200 MG PO TABS
ORAL_TABLET | ORAL | 0 refills | Status: DC
Start: 2023-10-18 — End: 2023-11-05

## 2023-10-18 NOTE — Patient Instructions (Addendum)
 Stop Flecainide  Today 10/18/23  Start Amiodarone 200 twice daily with food on Tuesday 10/22/23

## 2023-10-18 NOTE — Progress Notes (Signed)
 Primary Care Physician: Elizabeth Maiers, MD Referring Physician:  ER f/u   Elizabeth Sherman is a 83 y.o. female with a h/o new onset afib 01/14/16. This was dx at her PCP, picked up at the exam, pt was unaware. She then decided she would go to the ER for her heart to be evaluated. She was asymptomatic. She was given one eliquis  for that evening, rx for cardizem , and was referred here.  She was seen in the afib clinic for the first time, 01/20/16, by then on cardizem  and was in afib at 53 bpm,  stated that she feels well and denies any shortness of breath, or fatigue. 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 She does not thinks she snores, no smoking, no alcohol, no large amounts of caffeine.Chadsvasc score of at least 4.  Pt here for f/u, 8/14/`7 and is feeling well. She had one episode of chest pain for which she was evaluated for at [facility] in the ER, 01/23/16, and it had resolved by the time she reached the ER and has not returned. She is still unaware of afib but would like to attempt cardioversion when she has bee on blood thinner x 3 weeks. No issues with the anticoagulant and is taking regularly.  Returns to the afib clinic 02/27/16, f/u cardioversion. This was successful and she remains in SR. She thinks she may see slight improvement in SR but no significant change in her overall energy level.Remains on apixaban .  F/u in a fib clinc 05/28/16. She has been staying in SR. She feels well. Continues on eliquis , no bleeding issues.  F/u in afib clinic 02/22/17 and she is in afib at 92 bpm. She is unaware. She is under extra stress from her daughter being diagnosed with cancer and is coming with her to the Ca center at [place] 5 days a week, from [place].Elizabeth Sherman She continues on eliquis .  F/u in the afib clinic, 03/13/17. She continues with afib, rate controlled. She is minimally symptomatic with this but would like to undergo another cardioversion, however,  when to schedule it is up in the air, as her schedule is so full transporting her daughter to radiation. No missed dosed of eliquis .  F/u in afib clinic, 04/18/17, pt had been set up for cardioversion several weeks ago but called and cancelled as she developed an URI. She is now getting over it and is back in the afib clinic to further discuss. I am concerned that now she has been in afib for 2 months and her atrium are severly dilated. I am surprised that she stayed in SR as well as she did since last cardioversion about 2 years ago.  F/u in afib clinic 05/02/17, she has been started on flecainide   50 mg bid, after a low risk stress test. EKG shows no interval change but  remains in afib, rate controlled. She is over her URI and is feeling better from that standpoint. No missed doses of eliquis .  F/u in afib clinic, after successful cardioversion 9/01,but she feels dizzy despite being in SR.Elizabeth Sherman She called office last week with these c/o and flecainide  was reduced to 50 mg bid. Ekg shows SR at 85 bpm, intervals OK. Will try to reduce to 25 mg bid and see if symptoms improve. She refuses a flecainide  level today.  F/u in fib clinic, 08/12/17. She is staying in SR. She feels well. She called in after cardioversion c/o  dizziness and flecainide  was reduced to 25 mg bid. This has kept her in SR and eliminated her c/o dizziness.  F/u in afib clinic 02/03/18. She is doing well staying in SR on flecainide . Voices no complaints today.  F/u in afib clinic, 12/09/18 and is in SR. She has not noted any afib and is staying in rhythm on flecainide  50 mg bid. No issues with DOAC. She was having some dizziness and her PCP reduced her losartan  to 25 mg daily and this resolved the dizziness which reasonable BP control.  F/u in afib clinic, 03/27/19. She feels well. No awareness of afib. Continues on flecainide . No bleeding issues with eliquis .  F/u 09/29/19. She states no awareness of afib. Continues on flecainide . No issues  with bleeding on eliquis  with a CHA2DS2VASc score of 4.  F/u in afib clinic, 04/01/20. She is staying in SR on flecainide . No awareness of afib. No issues with bleeding, remains on 5 mg eliquis  bid.  CHA2DS2VASc score is  at least 4.  F/u in afib clinic, 08/31/20. She  reports feeling very well. No symptoms of afib. She continues on eliquis  with a CHA2DS2VASc score of 4.  F/u in afib clinic, 03/22/21. She continues on flecainide  with a CHA2DS2VASc score of 4. No issues with bleeding. No afib to report.   F/u in afib office, 09/12/21,  with afib noted last week at time of eye surgery. Pt is asymptomatic. She would prefer not to have a CV if possible. Discussed increasing rate control first and she is in agreement.   F/u in afib clinic 09/27/21 and she remains in rate controlled afib. No missed anticoagulation. Is compliant with flecainide . She states thjat the afib is not bothering her and she really does not want another cardioversion.   F/u in afib clinic, 10/10/21, as pt called back to clinic several days after last being seen and decided that she did want a cardioversion. She has recently noted more shortness of breath. She is not retaining fluid , as her weight is down 4 lbs. I stopped flecainide  on last visit as pt felt she wanted a rate control strategy but she has been back on flecainide  50 mg bid x one week.   F/u in afib clinic, 10/25/21. She had a successful cardioversion and is in SR today. BP elevated on presentation, rechecked with a systolic of 136. She states at home, her BP is usually under 130 systolic.  F/u in afib clinic, 05/01/22. She remains in SR. She has lost around 24 lbs. She has been under stress with the health of her boyfriend and is not eating as much.He is living with her  after his stroke. She remains in SR and on flecainide .   Follow up in the AF clinic 10/31/22. Patient reports that she has done well from an afib standpoint. She did fall and break her hip in February and  had surgery at [facility]. She is walking with a cane. No bleeding issues on anticoagulation.   Follow up in the AF clinic 05/08/23. Patient reports that she has done well since her last visit. She has not had any interim symptoms of afib. She did fall again and break her R elbow, now healed. No bleeding issues on anticoagulation.   Follow up 10/01/23. Patient returns for follow up for atrial fibrillation and flecainide  monitoring. She saw her PCP about two weeks ago and was found to have an irregular rhythm. In hindsight, patient has felt more fatigued for 3-4 weeks.  She is in afib today. There were no specific triggers that she could identify.   Follow up 10/18/23. Patient is s/p DCCV on 10/04/23. Unfortunately, she has had quick return of afib. She continues to have symptoms of fatigue and intermittent dizziness. No bleeding issues on anticoagulation.    Today, she  denies symptoms of palpitations, chest pain, shortness of breath, orthopnea, PND, lower extremity edema, presyncope, syncope, snoring, daytime somnolence, bleeding, or neurologic sequela. The patient is tolerating medications without difficulties and is otherwise without complaint today.    Past Medical History:  Diagnosis Date   A-fib (HCC)    Depression    Hyperlipidemia    Hypertension     Current Outpatient Medications  Medication Sig Dispense Refill   ALPRAZolam (XANAX) 0.25 MG tablet Take 0.0125 mg by mouth 3 (three) times daily as needed for anxiety.     apixaban  (ELIQUIS ) 5 MG TABS tablet Take 1 tablet (5 mg total) by mouth 2 (two) times daily. 56 tablet    busPIRone  (BUSPAR ) 5 MG tablet Take 1 tablet (5 mg total) by mouth 3 (three) times daily as needed. 15 tablet 1   Cholecalciferol (VITAMIN D3) 2000 units TABS Take 2,000 Units by mouth daily.     diltiazem  (CARDIZEM  CD) 120 MG 24 hr capsule Take 1 capsule (120 mg total) by mouth daily. 90 capsule 1   flecainide  (TAMBOCOR ) 50 MG tablet Take 1 tablet (50  mg total) by mouth 2 (two) times daily. 180 tablet 1   hydrochlorothiazide (HYDRODIURIL) 25 MG tablet Take 25 mg by mouth daily as needed (Ankle swelling).     traMADol (ULTRAM) 50 MG tablet Take 50 mg by mouth every 6 (six) hours as needed for severe pain.     No current facility-administered medications for this encounter.    ROS- All systems are reviewed and negative except as per the HPI above  Physical Exam: Vitals:   10/18/23 1052  BP: 138/72  Pulse: 78  Weight: 83.6 kg  Height: 5\' 6"  (1.676 m)     GEN: Well nourished, well developed in no acute distress NECK: No JVD CARDIAC: Irregularly irregular rate and rhythm, no murmurs, rubs, gallops RESPIRATORY:  Clear to auscultation without rales, wheezing or rhonchi  ABDOMEN: Soft, non-tender, non-distended EXTREMITIES:  No edema; No deformity     EKG today demonstrates Afib Vent. rate 78 BPM PR interval * ms QRS duration 102 ms QT/QTcB 372/424 ms   Echo--01/2016-  Left ventricle: The cavity size was normal. Wall thickness was   increased in a pattern of mild LVH. Systolic function was normal.   The estimated ejection fraction was in the range of 60% to 65%.   Indeterminant diastolic function (atrial fibrillation). Wall   motion was normal; there were no regional wall motion   abnormalities. - Aortic valve: There was no stenosis. - Mitral valve: There was trivial regurgitation. - Left atrium: The atrium was severely dilated, 41 mm, diameter, LA volume 103 ml - Right ventricle: The cavity size was normal. Systolic function   was normal. - Right atrium: The atrium was severely dilated. - Pulmonary arteries: No complete TR doppler jet so unable to   estimate PA systolic pressure. - Inferior vena cava: The vessel was normal in size. The   respirophasic diameter changes were in the normal range (>= 50%),   consistent with normal central venous pressure. - Pericardium, extracardiac: Prominent epicardial adipose tissue.    Impressions:   - The patient was in atrial fibrillation.  Normal LV size with mild   LV hypertrophy. EF 60-65%. Normal RV size and systolic function.   No significant valvular abnormalities. Severe biatrial   enlargement.    CHA2DS2-VASc Score = 4  The patient's score is based upon: CHF History: 0 HTN History: 1 Diabetes History: 0 Stroke History: 0 Vascular Disease History: 0 Age Score: 2 Gender Score: 1       ASSESSMENT AND PLAN: Persistent Atrial Fibrillation (ICD10:  I48.19) The patient's CHA2DS2-VASc score is 4, indicating a 4.8% annual risk of stroke.   S/p DCCV 10/04/23 with quick return of afib We discussed rhythm control options today. 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 Could consider dofetilide or amiodarone.  After discussing the risks and benefits, will start amiodarone 200 mg BID after a 3 day washout of flecainide . If she does not chemically convert, will plan for repeat DCCV. Will decrease amiodarone to 200 mg daily after 4 weeks.  Cmet and TSH in Media tab 10/01/23 reviewed.  Continue diltiazem  120 mg daily Continue Eliquis  5 mg BID  Secondary Hypercoagulable State (ICD10:  D68.69) The patient is at significant risk for stroke/thromboembolism based upon her CHA2DS2-VASc Score of 4.  Continue Apixaban  (Eliquis ). No bleeding issues.   HTN Stable on current regimen Has h/o white coat HTN    Follow up in the AF clinic in 3 weeks.     Myrtha Ates PA-C [facility] [address] [place], [place] [address] [phone]

## 2023-11-05 ENCOUNTER — Other Ambulatory Visit (HOSPITAL_COMMUNITY): Payer: Self-pay

## 2023-11-05 MED ORDER — AMIODARONE HCL 200 MG PO TABS: ORAL_TABLET | ORAL | 0 refills | Status: DC

## 2023-11-06 ENCOUNTER — Ambulatory Visit (HOSPITAL_COMMUNITY): Payer: PRIVATE HEALTH INSURANCE | Admitting: Physician Assistant

## 2023-11-08 ENCOUNTER — Ambulatory Visit (HOSPITAL_COMMUNITY): Admitting: Physician Assistant

## 2023-11-22 ENCOUNTER — Ambulatory Visit (HOSPITAL_COMMUNITY)
Admission: RE | Admit: 2023-11-22 | Discharge: 2023-11-22 | Disposition: A | Discharge status: Home / Self Care | Source: Ambulatory Visit | Attending: Physician Assistant | Admitting: Physician Assistant

## 2023-11-22 ENCOUNTER — Encounter (HOSPITAL_COMMUNITY): Payer: Self-pay | Admitting: Physician Assistant

## 2023-11-22 ENCOUNTER — Other Ambulatory Visit (HOSPITAL_COMMUNITY): Payer: Self-pay | Admitting: Physician Assistant

## 2023-11-22 VITALS — BP 162/90 | HR 85 | Ht 66.0 in | Wt 182.4 lb

## 2023-11-22 DIAGNOSIS — I4819 Other persistent atrial fibrillation: Secondary | ICD-10-CM

## 2023-11-22 DIAGNOSIS — Z5181 Encounter for therapeutic drug level monitoring: Secondary | ICD-10-CM | POA: Insufficient documentation

## 2023-11-22 DIAGNOSIS — D6869 Other thrombophilia: Secondary | ICD-10-CM

## 2023-11-22 DIAGNOSIS — Z79899 Other long term (current) drug therapy: Secondary | ICD-10-CM

## 2023-11-22 MED ORDER — AMIODARONE HCL 200 MG PO TABS: 200.0000 mg | ORAL_TABLET | Freq: Every day | ORAL | 3 refills | Status: DC

## 2023-11-22 NOTE — Patient Instructions (Signed)
 Cardioversion scheduled for:  Friday June 20th 2025   - Arrive at the [employer] [facility] of [facility] ([address])  and check in with ADMITTING at 8am   - Do not eat or drink anything after midnight the night prior to your procedure.   - Take all your morning medication (except diabetic medications) with a sip of water prior to arrival.  - Do NOT miss any doses of your blood thinner - if you should miss a dose or take a dose more than 4 hours late -- please notify our office immediately.  - You will not be able to drive home after your procedure. Please ensure you have a responsible adult to drive you home. You will need someone with you for 24 hours post procedure.     - Expect to be in the procedural area approximately 2 hours.   - If you feel as if you go back into normal rhythm prior to scheduled cardioversion, please notify our office immediately.   If your procedure is canceled in the cardioversion suite you will be charged a cancellation fee.

## 2023-11-22 NOTE — Progress Notes (Signed)
 Primary Care Physician: Huston Maiers, MD Referring Physician:  ER f/u   Elizabeth Sherman is a 83 y.o. female with a h/o new onset afib 01/14/16. This was dx at her PCP, picked up at the exam, pt was unaware. She then decided she would go to the ER for her heart to be evaluated. She was asymptomatic. She was given one eliquis  for that evening, rx for cardizem , and was referred here.  She was seen in the afib clinic for the first time, 01/20/16, by then on cardizem  and was in afib at 53 bpm,  stated that she feels well and denies any shortness of breath, or fatigue. 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 She does not thinks she snores, no smoking, no alcohol, no large amounts of caffeine.Chadsvasc score of at least 4.  Pt here for f/u, 8/14/`7 and is feeling well. She had one episode of chest pain for which she was evaluated for at [facility] hospital in the ER, 01/23/16, and it had resolved by the time she reached the ER and has not returned. She is still unaware of afib but would like to attempt cardioversion when she has bee on blood thinner x 3 weeks. No issues with the anticoagulant and is taking regularly.  Returns to the afib clinic 02/27/16, f/u cardioversion. This was successful and she remains in SR. She thinks she may see slight improvement in SR but no significant change in her overall energy level.Remains on apixaban .  F/u in a fib clinc 05/28/16. She has been staying in SR. She feels well. Continues on eliquis , no bleeding issues.  F/u in afib clinic 02/22/17 and she is in afib at 92 bpm. She is unaware. She is under extra stress from her daughter being diagnosed with cancer and is coming with her to the Ca center at [facility] 5 days a week, from [place].[name] She continues on eliquis .  F/u in the afib clinic, 03/13/17. She continues with afib, rate controlled. She is minimally symptomatic with this but would like to undergo another cardioversion, however,  when to schedule it is up in the air, as her schedule is so full transporting her daughter to radiation. No missed dosed of eliquis .  F/u in afib clinic, 04/18/17, pt had been set up for cardioversion several weeks ago but called and cancelled as she developed an URI. She is now getting over it and is back in the afib clinic to further discuss. I am concerned that now she has been in afib for 2 months and her atrium are severly dilated. I am surprised that she stayed in SR as well as she did since last cardioversion about 2 years ago.  F/u in afib clinic 05/02/17, she has been started on flecainide   50 mg bid, after a low risk stress test. EKG shows no interval change but  remains in afib, rate controlled. She is over her URI and is feeling better from that standpoint. No missed doses of eliquis .  F/u in afib clinic, after successful cardioversion 9/01,but she feels dizzy despite being in SR.[name] She called office last week with these c/o and flecainide  was reduced to 50 mg bid. Ekg shows SR at 85 bpm, intervals OK. Will try to reduce to 25 mg bid and see if symptoms improve. She refuses a flecainide  level today.  F/u in fib clinic, 08/12/17. She is staying in SR. She feels well. She called in after cardioversion c/o  dizziness and flecainide  was reduced to 25 mg bid. This has kept her in SR and eliminated her c/o dizziness.  F/u in afib clinic 02/03/18. She is doing well staying in SR on flecainide . Voices no complaints today.  F/u in afib clinic, 12/09/18 and is in SR. She has not noted any afib and is staying in rhythm on flecainide  50 mg bid. No issues with DOAC. She was having some dizziness and her PCP reduced her losartan  to 25 mg daily and this resolved the dizziness which reasonable BP control.  F/u in afib clinic, 03/27/19. She feels well. No awareness of afib. Continues on flecainide . No bleeding issues with eliquis .  F/u 09/29/19. She states no awareness of afib. Continues on flecainide . No issues  with bleeding on eliquis  with a CHA2DS2VASc score of 4.  F/u in afib clinic, 04/01/20. She is staying in SR on flecainide . No awareness of afib. No issues with bleeding, remains on 5 mg eliquis  bid.  CHA2DS2VASc score is  at least 4.  F/u in afib clinic, 08/31/20. She  reports feeling very well. No symptoms of afib. She continues on eliquis  with a CHA2DS2VASc score of 4.  F/u in afib clinic, 03/22/21. She continues on flecainide  with a CHA2DS2VASc score of 4. No issues with bleeding. No afib to report.   F/u in afib office, 09/12/21,  with afib noted last week at time of eye surgery. Pt is asymptomatic. She would prefer not to have a CV if possible. Discussed increasing rate control first and she is in agreement.   F/u in afib clinic 09/27/21 and she remains in rate controlled afib. No missed anticoagulation. Is compliant with flecainide . She states thjat the afib is not bothering her and she really does not want another cardioversion.   F/u in afib clinic, 10/10/21, as pt called back to clinic several days after last being seen and decided that she did want a cardioversion. She has recently noted more shortness of breath. She is not retaining fluid , as her weight is down 4 lbs. I stopped flecainide  on last visit as pt felt she wanted a rate control strategy but she has been back on flecainide  50 mg bid x one week.   F/u in afib clinic, 10/25/21. She had a successful cardioversion and is in SR today. BP elevated on presentation, rechecked with a systolic of 136. She states at home, her BP is usually under 130 systolic.  F/u in afib clinic, 05/01/22. She remains in SR. She has lost around 24 lbs. She has been under stress with the health of her boyfriend and is not eating as much.He is living with her  after his stroke. She remains in SR and on flecainide .   Follow up in the AF clinic 10/31/22. Patient reports that she has done well from an afib standpoint. She did fall and break her hip in February and  had surgery at [facility]. She is walking with a cane. No bleeding issues on anticoagulation.   Follow up in the AF clinic 05/08/23. Patient reports that she has done well since her last visit. She has not had any interim symptoms of afib. She did fall again and break her R elbow, now healed. No bleeding issues on anticoagulation.   Follow up 10/01/23. Patient returns for follow up for atrial fibrillation and flecainide  monitoring. She saw her PCP about two weeks ago and was found to have an irregular rhythm. In hindsight, patient has felt more fatigued for 3-4 weeks.  She is in afib today. There were no specific triggers that she could identify.   Follow up 10/18/23. Patient is s/p DCCV on 10/04/23. Unfortunately, she has had quick return of afib. She continues to have symptoms of fatigue and intermittent dizziness. No bleeding issues on anticoagulation.   Follow up 11/22/23. Patient returns for follow up for atrial fibrillation and amiodarone  monitoring. She remains in rate controlled afib with fatigue and intermittent dizziness. No bleeding issues on anticoagulation. No side effects since starting amiodarone .    Today, she  denies symptoms of palpitations, chest pain, shortness of breath, orthopnea, PND, lower extremity edema, presyncope, syncope, snoring, daytime somnolence, bleeding, or neurologic sequela. The patient is tolerating medications without difficulties and is otherwise without complaint today.    Past Medical History:  Diagnosis Date   A-fib (HCC)    Depression    Hyperlipidemia    Hypertension     Current Outpatient Medications  Medication Sig Dispense Refill   ALPRAZolam (XANAX) 0.25 MG tablet Take 0.0125 mg by mouth 3 (three) times daily as needed for anxiety.     amiodarone  (PACERONE ) 200 MG tablet Take 1 tablet (200 mg total) by mouth 2 (two) times daily for 30 days, THEN 1 tablet (200 mg total) daily. 90 tablet 0   apixaban  (ELIQUIS ) 5 MG TABS tablet Take 1  tablet (5 mg total) by mouth 2 (two) times daily. 56 tablet    busPIRone  (BUSPAR ) 5 MG tablet Take 1 tablet (5 mg total) by mouth 3 (three) times daily as needed. 15 tablet 1   Cholecalciferol (VITAMIN D3) 2000 units TABS Take 2,000 Units by mouth daily.     diltiazem  (CARDIZEM  CD) 120 MG 24 hr capsule Take 1 capsule (120 mg total) by mouth daily. 90 capsule 1   hydrochlorothiazide (HYDRODIURIL) 25 MG tablet Take 25 mg by mouth daily as needed (Ankle swelling).     traMADol (ULTRAM) 50 MG tablet Take 50 mg by mouth every 6 (six) hours as needed for severe pain.     No current facility-administered medications for this encounter.    ROS- All systems are reviewed and negative except as per the HPI above  Physical Exam: Vitals:   11/22/23 1517  BP: (!) 162/90  Pulse: 85  Weight: 82.7 kg  Height: 5\' 6"  (1.676 m)    GEN: Well nourished, well developed in no acute distress CARDIAC: Irregularly irregular rate and rhythm, no murmurs, rubs, gallops RESPIRATORY:  Clear to auscultation without rales, wheezing or rhonchi  ABDOMEN: Soft, non-tender, non-distended EXTREMITIES:  No edema; No deformity     EKG today demonstrates Afib Vent. rate 85 BPM PR interval * ms QRS duration 92 ms QT/QTcB 348/414 ms   Echo--01/2016-  Left ventricle: The cavity size was normal. Wall thickness was   increased in a pattern of mild LVH. Systolic function was normal.   The estimated ejection fraction was in the range of 60% to 65%.   Indeterminant diastolic function (atrial fibrillation). Wall   motion was normal; there were no regional wall motion   abnormalities. - Aortic valve: There was no stenosis. - Mitral valve: There was trivial regurgitation. - Left atrium: The atrium was severely dilated, 41 mm, diameter, LA volume 103 ml - Right ventricle: The cavity size was normal. Systolic function   was normal. - Right atrium: The atrium was severely dilated. - Pulmonary arteries: No complete TR  doppler jet so unable to   estimate PA systolic pressure. - Inferior vena cava: The  vessel was normal in size. The   respirophasic diameter changes were in the normal range (>= 50%),   consistent with normal central venous pressure. - Pericardium, extracardiac: Prominent epicardial adipose tissue.   Impressions:   - The patient was in atrial fibrillation. Normal LV size with mild   LV hypertrophy. EF 60-65%. Normal RV size and systolic function.   No significant valvular abnormalities. Severe biatrial   enlargement.   CHA2DS2-VASc Score = 4  The patient's score is based upon: CHF History: 0 HTN History: 1 Diabetes History: 0 Stroke History: 0 Vascular Disease History: 0 Age Score: 2 Gender Score: 1       ASSESSMENT AND PLAN: Persistent Atrial Fibrillation (ICD10:  I48.19) The patient's CHA2DS2-VASc score is 4, indicating a 4.8% annual risk of stroke.   S/p DCCV 10/04/23 with quick return of afib, now on amiodarone .  Will plan for repeat DCCV now that she has loaded on amiodarone .  Check bmet/cbc Continue amiodarone  200 mg daily Continue diltiazem  120 mg daily Continue Eliquis  5 mg BID  Secondary Hypercoagulable State (ICD10:  D68.69) The patient is at significant risk for stroke/thromboembolism based upon her CHA2DS2-VASc Score of 4.  Continue Apixaban  (Eliquis ). No bleeding issues.   High Risk Medication Monitoring (ICD 10: Z79.899) Intervals on ECG acceptable for amiodarone  monitoring.   HTN Elevated today. Has known h/o white coat HTN No changes today    Follow up in the AF clinic post DCCV.    Informed Consent   Shared Decision Making/Informed Consent The risks (stroke, cardiac arrhythmias rarely resulting in the need for a temporary or permanent pacemaker, skin irritation or burns and complications associated with conscious sedation including aspiration, arrhythmia, respiratory failure and death), benefits (restoration of normal sinus rhythm) and alternatives  of a direct current cardioversion were explained in detail to Ms. Overacker and she agrees to proceed.       Myrtha Ates PA-C Afib [facility] [address] [place], [place] [address] [phone]

## 2023-11-23 LAB — CBC
Hematocrit: 43.7 % (ref 34.0–46.6)
Hemoglobin: 14.2 g/dL (ref 11.1–15.9)
MCH: 27.4 pg (ref 26.6–33.0)
MCHC: 32.5 g/dL (ref 31.5–35.7)
MCV: 84 fL (ref 79–97)
Platelets: 234 10*3/uL (ref 150–450)
RBC: 5.19 x10E6/uL (ref 3.77–5.28)
RDW: 14.8 % (ref 11.7–15.4)
WBC: 7.2 10*3/uL (ref 3.4–10.8)

## 2023-11-23 LAB — BASIC METABOLIC PANEL WITH GFR
BUN/Creatinine Ratio: 15 (ref 12–28)
BUN: 18 mg/dL (ref 8–27)
CO2: 24 mmol/L (ref 20–29)
Calcium: 8.9 mg/dL (ref 8.7–10.3)
Chloride: 105 mmol/L (ref 96–106)
Creatinine, Ser: 1.23 mg/dL — ABNORMAL HIGH (ref 0.57–1.00)
Glucose: 100 mg/dL — ABNORMAL HIGH (ref 70–99)
Potassium: 4.2 mmol/L (ref 3.5–5.2)
Sodium: 141 mmol/L (ref 134–144)
eGFR: 44 mL/min/{1.73_m2} — ABNORMAL LOW (ref >59)

## 2023-11-25 ENCOUNTER — Ambulatory Visit (HOSPITAL_COMMUNITY): Payer: Self-pay | Admitting: Physician Assistant

## 2023-11-25 ENCOUNTER — Ambulatory Visit (HOSPITAL_COMMUNITY): Admitting: Physician Assistant

## 2023-12-04 ENCOUNTER — Telehealth (HOSPITAL_COMMUNITY): Payer: Self-pay | Admitting: *Deleted

## 2023-12-04 NOTE — Telephone Encounter (Signed)
 Received call from pt stating she needs to cancel DCCV because her family is sick with no one to bring her. She does state she is feeling good with HR in the 80's. She wants to keep her appt for 12/13/23 to check rhythm.  Discussed the plan with Ricky P.A. who agrees with plan .

## 2023-12-06 ENCOUNTER — Encounter (HOSPITAL_COMMUNITY): Admission: RE | Payer: Self-pay | Source: Home / Self Care

## 2023-12-06 ENCOUNTER — Ambulatory Visit (HOSPITAL_COMMUNITY): Admission: RE | Admit: 2023-12-06 | Source: Home / Self Care | Admitting: Cardiology

## 2023-12-06 DIAGNOSIS — I4819 Other persistent atrial fibrillation: Secondary | ICD-10-CM

## 2023-12-06 SURGERY — CARDIOVERSION (CATH LAB)
Anesthesia: Monitor Anesthesia Care

## 2023-12-09 ENCOUNTER — Telehealth (HOSPITAL_COMMUNITY): Payer: Self-pay

## 2023-12-09 MED ORDER — AMIODARONE HCL 200 MG PO TABS: ORAL_TABLET | ORAL | Status: DC

## 2023-12-09 NOTE — Addendum Note (Signed)
 Addended by: EZZARD PALMA F on: 12/09/2023 03:51 PM   Modules accepted: Orders

## 2023-12-09 NOTE — Telephone Encounter (Signed)
 Patient called in regarding her Amiodarone  medication. Her dizziness has been much worse since she started Amiodarone . This morning she cut her dose in half. She wanted to know if it is ok to take 1/2 tablet twice daily. Per Glade Gift inform patient it is ok to take 1/2 tablet of her Amiodarone  200 mg tablet twice daily. Consulted with patient and she verbalized understanding.

## 2023-12-13 ENCOUNTER — Ambulatory Visit (HOSPITAL_COMMUNITY): Admitting: Physician Assistant

## 2023-12-26 ENCOUNTER — Other Ambulatory Visit (HOSPITAL_COMMUNITY): Payer: Self-pay | Admitting: Physician Assistant

## 2024-01-13 ENCOUNTER — Other Ambulatory Visit (HOSPITAL_COMMUNITY): Payer: Self-pay | Admitting: Physician Assistant

## 2024-02-21 ENCOUNTER — Ambulatory Visit (HOSPITAL_COMMUNITY): Admitting: Physician Assistant

## 2024-02-24 ENCOUNTER — Encounter (HOSPITAL_COMMUNITY): Payer: Self-pay | Admitting: Physician Assistant

## 2024-02-24 ENCOUNTER — Ambulatory Visit (HOSPITAL_COMMUNITY)
Admission: RE | Admit: 2024-02-24 | Discharge: 2024-02-24 | Disposition: A | Discharge status: Home / Self Care | Source: Ambulatory Visit | Attending: Physician Assistant | Admitting: Physician Assistant

## 2024-02-24 VITALS — BP 152/90 | HR 81 | Ht 66.0 in | Wt 178.6 lb

## 2024-02-24 DIAGNOSIS — Z7901 Long term (current) use of anticoagulants: Secondary | ICD-10-CM | POA: Diagnosis not present

## 2024-02-24 DIAGNOSIS — I10 Essential (primary) hypertension: Secondary | ICD-10-CM | POA: Diagnosis not present

## 2024-02-24 DIAGNOSIS — I4819 Other persistent atrial fibrillation: Secondary | ICD-10-CM | POA: Diagnosis not present

## 2024-02-24 DIAGNOSIS — Z5181 Encounter for therapeutic drug level monitoring: Secondary | ICD-10-CM | POA: Diagnosis not present

## 2024-02-24 DIAGNOSIS — Z79899 Other long term (current) drug therapy: Secondary | ICD-10-CM | POA: Insufficient documentation

## 2024-02-24 DIAGNOSIS — D6869 Other thrombophilia: Secondary | ICD-10-CM | POA: Diagnosis not present

## 2024-02-24 NOTE — Progress Notes (Signed)
 Primary Care Physician: Donnise Norleen Lenis, MD Referring Physician:  ER f/u   Elizabeth Sherman is a 83 y.o. female with a h/o new onset afib 01/14/16. This was dx at her PCP, picked up at the exam, pt was unaware. She then decided she would go to the ER for her heart to be evaluated. She was asymptomatic. She was given one eliquis  for that evening, rx for cardizem , and was referred here.  She was seen in the afib clinic for the first time, 01/20/16, by then on cardizem  and was in afib at 53 bpm,  stated that she feels well and denies any shortness of breath, or fatigue. 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 She does not thinks she snores, no smoking, no alcohol, no large amounts of caffeine.Chadsvasc score of at least 4.  Pt here for f/u, 8/14/`7 and is feeling well. She had one episode of chest pain for which she was evaluated for at [facility] in the ER, 01/23/16, and it had resolved by the time she reached the ER and has not returned. She is still unaware of afib but would like to attempt cardioversion when she has bee on blood thinner x 3 weeks. No issues with the anticoagulant and is taking regularly.  Returns to the afib clinic 02/27/16, f/u cardioversion. This was successful and she remains in SR. She thinks she may see slight improvement in SR but no significant change in her overall energy level.Remains on apixaban .  F/u in a fib clinc 05/28/16. She has been staying in SR. She feels well. Continues on eliquis , no bleeding issues.  F/u in afib clinic 02/22/17 and she is in afib at 92 bpm. She is unaware. She is under extra stress from her daughter being diagnosed with cancer and is coming with her to the [facility] center at [facility] 5 days a week, from [place].[name] She continues on eliquis .  F/u in the afib clinic, 03/13/17. She continues with afib, rate controlled. She is minimally symptomatic with this but would like to undergo another cardioversion, however,  when to schedule it is up in the air, as her schedule is so full transporting her daughter to radiation. No missed dosed of eliquis .  F/u in afib clinic, 04/18/17, pt had been set up for cardioversion several weeks ago but called and cancelled as she developed an URI. She is now getting over it and is back in the afib clinic to further discuss. I am concerned that now she has been in afib for 2 months and her atrium are severly dilated. I am surprised that she stayed in SR as well as she did since last cardioversion about 2 years ago.  F/u in afib clinic 05/02/17, she has been started on flecainide   50 mg bid, after a low risk stress test. EKG shows no interval change but  remains in afib, rate controlled. She is over her URI and is feeling better from that standpoint. No missed doses of eliquis .  F/u in afib clinic, after successful cardioversion 9/01,but she feels dizzy despite being in SR.[name] She called office last week with these c/o and flecainide  was reduced to 50 mg bid. Ekg shows SR at 85 bpm, intervals OK. Will try to reduce to 25 mg bid and see if symptoms improve. She refuses a flecainide  level today.  F/u in fib clinic, 08/12/17. She is staying in SR. She feels well. She called in after cardioversion c/o  dizziness and flecainide  was reduced to 25 mg bid. This has kept her in SR and eliminated her c/o dizziness.  F/u in afib clinic 02/03/18. She is doing well staying in SR on flecainide . Voices no complaints today.  F/u in afib clinic, 12/09/18 and is in SR. She has not noted any afib and is staying in rhythm on flecainide  50 mg bid. No issues with DOAC. She was having some dizziness and her PCP reduced her losartan  to 25 mg daily and this resolved the dizziness which reasonable BP control.  F/u in afib clinic, 03/27/19. She feels well. No awareness of afib. Continues on flecainide . No bleeding issues with eliquis .  F/u 09/29/19. She states no awareness of afib. Continues on flecainide . No issues  with bleeding on eliquis  with a CHA2DS2VASc score of 4.  F/u in afib clinic, 04/01/20. She is staying in SR on flecainide . No awareness of afib. No issues with bleeding, remains on 5 mg eliquis  bid.  CHA2DS2VASc score is  at least 4.  F/u in afib clinic, 08/31/20. She  reports feeling very well. No symptoms of afib. She continues on eliquis  with a CHA2DS2VASc score of 4.  F/u in afib clinic, 03/22/21. She continues on flecainide  with a CHA2DS2VASc score of 4. No issues with bleeding. No afib to report.   F/u in afib office, 09/12/21,  with afib noted last week at time of eye surgery. Pt is asymptomatic. She would prefer not to have a CV if possible. Discussed increasing rate control first and she is in agreement.   F/u in afib clinic 09/27/21 and she remains in rate controlled afib. No missed anticoagulation. Is compliant with flecainide . She states thjat the afib is not bothering her and she really does not want another cardioversion.   F/u in afib clinic, 10/10/21, as pt called back to clinic several days after last being seen and decided that she did want a cardioversion. She has recently noted more shortness of breath. She is not retaining fluid , as her weight is down 4 lbs. I stopped flecainide  on last visit as pt felt she wanted a rate control strategy but she has been back on flecainide  50 mg bid x one week.   F/u in afib clinic, 10/25/21. She had a successful cardioversion and is in SR today. BP elevated on presentation, rechecked with a systolic of 136. She states at home, her BP is usually under 130 systolic.  F/u in afib clinic, 05/01/22. She remains in SR. She has lost around 24 lbs. She has been under stress with the health of her boyfriend and is not eating as much.He is living with her  after his stroke. She remains in SR and on flecainide .   Follow up in the AF clinic 10/31/22. Patient reports that she has done well from an afib standpoint. She did fall and break her hip in February and  had surgery at [facility]. She is walking with a cane. No bleeding issues on anticoagulation.   Follow up in the AF clinic 05/08/23. Patient reports that she has done well since her last visit. She has not had any interim symptoms of afib. She did fall again and break her R elbow, now healed. No bleeding issues on anticoagulation.   Follow up 10/01/23. Patient returns for follow up for atrial fibrillation and flecainide  monitoring. She saw her PCP about two weeks ago and was found to have an irregular rhythm. In hindsight, patient has felt more fatigued for 3-4 weeks.  She is in afib today. There were no specific triggers that she could identify.   Follow up 10/18/23. Patient is s/p DCCV on 10/04/23. Unfortunately, she has had quick return of afib. She continues to have symptoms of fatigue and intermittent dizziness. No bleeding issues on anticoagulation.   Follow up 11/22/23. Patient returns for follow up for atrial fibrillation and amiodarone  monitoring. She remains in rate controlled afib with fatigue and intermittent dizziness. No bleeding issues on anticoagulation. No side effects since starting amiodarone .   Follow up 02/24/24. Patient returns for follow up for atrial fibrillation and amiodarone  monitoring. She was scheduled for DCCV on 12/06/23 but this was cancelled because her ride fell through. She remains in afib today although she admits she is not as symptomatic. Her intermittent dizziness continues. No bleeding issues on anticoagulation.    Today, she  denies symptoms of palpitations, chest pain, shortness of breath, orthopnea, PND, lower extremity edema, presyncope, syncope, snoring, daytime somnolence, bleeding, or neurologic sequela. The patient is tolerating medications without difficulties and is otherwise without complaint today.    Past Medical History:  Diagnosis Date   A-fib (HCC)    Depression    Hyperlipidemia    Hypertension     Current Outpatient Medications   Medication Sig Dispense Refill   amiodarone  (PACERONE ) 200 MG tablet Take 1/2 tablet by mouth twice daily     busPIRone  (BUSPAR ) 5 MG tablet Take 1 tablet (5 mg total) by mouth 3 (three) times daily as needed. (Patient taking differently: Take 5 mg by mouth 2 (two) times daily.) 15 tablet 1   Cholecalciferol (VITAMIN D) 50 MCG (2000 UT) tablet Take 2,000 Units by mouth daily.     diltiazem  (CARDIZEM  CD) 120 MG 24 hr capsule TAKE 1 CAPSULE BY MOUTH DAILY 90 capsule 1   ELIQUIS  5 MG TABS tablet TAKE 1 TABLET BY MOUTH TWICE  DAILY 180 tablet 3   hydrochlorothiazide (HYDRODIURIL) 25 MG tablet Take 25 mg by mouth daily as needed (Ankle swelling).     traMADol (ULTRAM) 50 MG tablet Take 100 mg by mouth every 6 (six) hours as needed for severe pain (pain score 7-10).     No current facility-administered medications for this encounter.    ROS- All systems are reviewed and negative except as per the HPI above  Physical Exam: Vitals:   02/24/24 1057  BP: (!) 152/90  Pulse: 81  Weight: 81 kg  Height: 5' 6 (1.676 m)    GEN: Well nourished, well developed in no acute distress CARDIAC: Irregularly irregular rate and rhythm, no murmurs, rubs, gallops RESPIRATORY:  Clear to auscultation without rales, wheezing or rhonchi  ABDOMEN: Soft, non-tender, non-distended EXTREMITIES:  No edema; No deformity     EKG today demonstrates Afib Vent. rate 81 BPM PR interval * ms QRS duration 90 ms QT/QTcB 346/401 ms   Echo--01/2016-  Left ventricle: The cavity size was normal. Wall thickness was   increased in a pattern of mild LVH. Systolic function was normal.   The estimated ejection fraction was in the range of 60% to 65%.   Indeterminant diastolic function (atrial fibrillation). Wall   motion was normal; there were no regional wall motion   abnormalities. - Aortic valve: There was no stenosis. - Mitral valve: There was trivial regurgitation. - Left atrium: The atrium was severely dilated, 41  mm, diameter, LA volume 103 ml - Right ventricle: The cavity size was normal. Systolic function   was normal. - Right atrium: The atrium  was severely dilated. - Pulmonary arteries: No complete TR doppler jet so unable to   estimate PA systolic pressure. - Inferior vena cava: The vessel was normal in size. The   respirophasic diameter changes were in the normal range (>= 50%),   consistent with normal central venous pressure. - Pericardium, extracardiac: Prominent epicardial adipose tissue.   Impressions:   - The patient was in atrial fibrillation. Normal LV size with mild   LV hypertrophy. EF 60-65%. Normal RV size and systolic function.   No significant valvular abnormalities. Severe biatrial   enlargement.   CHA2DS2-VASc Score = 4  The patient's score is based upon: CHF History: 0 HTN History: 1 Diabetes History: 0 Stroke History: 0 Vascular Disease History: 0 Age Score: 2 Gender Score: 1       ASSESSMENT AND PLAN: Persistent Atrial Fibrillation (ICD10:  I48.19) The patient's CHA2DS2-VASc score is 4, indicating a 4.8% annual risk of stroke.   Patient loaded on amiodarone  following quick return of afib after last DCCV. We discussed rhythm control options including DCCV. She is not sure she wants to proceed. If she decides to pursue rate control only, would stop amiodarone  and increase diltiazem .  Continue amiodarone  200 mg daily (taken 100 mg BID) for now.  Continue diltiazem  120 mg daily Continue Eliquis  5 mg BID  Secondary Hypercoagulable State (ICD10:  D68.69) The patient is at significant risk for stroke/thromboembolism based upon her CHA2DS2-VASc Score of 4.  Continue Apixaban  (Eliquis ). No bleeding issues.   High Risk Medication Monitoring (ICD 10: [id number]) Patient requires ongoing monitoring for anti-arrhythmic medication which has the potential to cause life threatening arrhythmias. Intervals on ECG acceptable for amiodarone  monitoring.   HTN Mildly elevated  today Has documented h/p white coat HTN No changes today.    Follow up in the AF clinic in 2 weeks to revisit rate vs rhythm control.      Daril Kicks PA-C Afib Clinic [facility] [address] [place], [place] [address] [phone]

## 2024-03-04 ENCOUNTER — Telehealth (HOSPITAL_COMMUNITY): Payer: Self-pay | Admitting: *Deleted

## 2024-03-04 NOTE — Telephone Encounter (Signed)
 Patient called in stating she continues to have issues with dizziness to the point she is afraid to drive.  BP running 105-127/62-81 HR 65-74 at home. Discussed with Daril Kicks PA will stop amiodarone  today. Pt in agreement will call with update next week of response.

## 2024-03-23 ENCOUNTER — Ambulatory Visit (HOSPITAL_COMMUNITY)
Admission: RE | Admit: 2024-03-23 | Discharge: 2024-03-23 | Disposition: A | Discharge status: Home / Self Care | Source: Ambulatory Visit | Attending: Physician Assistant | Admitting: Physician Assistant

## 2024-03-23 VITALS — BP 162/90 | HR 83 | Ht 66.0 in | Wt 176.0 lb

## 2024-03-23 DIAGNOSIS — I10 Essential (primary) hypertension: Secondary | ICD-10-CM | POA: Diagnosis not present

## 2024-03-23 DIAGNOSIS — I4891 Unspecified atrial fibrillation: Secondary | ICD-10-CM | POA: Diagnosis not present

## 2024-03-23 DIAGNOSIS — I4819 Other persistent atrial fibrillation: Secondary | ICD-10-CM | POA: Insufficient documentation

## 2024-03-23 DIAGNOSIS — D6869 Other thrombophilia: Secondary | ICD-10-CM | POA: Diagnosis not present

## 2024-03-23 DIAGNOSIS — Z7901 Long term (current) use of anticoagulants: Secondary | ICD-10-CM | POA: Insufficient documentation

## 2024-03-23 DIAGNOSIS — Z79899 Other long term (current) drug therapy: Secondary | ICD-10-CM | POA: Insufficient documentation

## 2024-03-23 NOTE — Progress Notes (Signed)
 Primary Care Physician: Donnise Norleen Lenis, MD Referring Physician:  ER f/u   Elizabeth Sherman is a 83 y.o. female with a h/o new onset afib 01/14/16. This was dx at her PCP, picked up at the exam, pt was unaware. She then decided she would go to the ER for her heart to be evaluated. She was asymptomatic. She was given one eliquis  for that evening, rx for cardizem , and was referred here.  She was seen in the afib clinic for the first time, 01/20/16, by then on cardizem  and was in afib at 53 bpm,  stated that she feels well and denies any shortness of breath, or fatigue. 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 She does not thinks she snores, no smoking, no alcohol, no large amounts of caffeine.Chadsvasc score of at least 4.  Pt here for f/u, 8/14/`7 and is feeling well. She had one episode of chest pain for which she was evaluated for at [facility] hospital in the ER, 01/23/16, and it had resolved by the time she reached the ER and has not returned. She is still unaware of afib but would like to attempt cardioversion when she has bee on blood thinner x 3 weeks. No issues with the anticoagulant and is taking regularly.  Returns to the afib clinic 02/27/16, f/u cardioversion. This was successful and she remains in SR. She thinks she may see slight improvement in SR but no significant change in her overall energy level.Remains on apixaban .  F/u in a fib clinc 05/28/16. She has been staying in SR. She feels well. Continues on eliquis , no bleeding issues.  F/u in afib clinic 02/22/17 and she is in afib at 92 bpm. She is unaware. She is under extra stress from her daughter being diagnosed with cancer and is coming with her to the Ca center at [place] 5 days a week, from [place].Elizabeth Sherman She continues on eliquis .  F/u in the afib clinic, 03/13/17. She continues with afib, rate controlled. She is minimally symptomatic with this but would like to undergo another cardioversion, however,  when to schedule it is up in the air, as her schedule is so full transporting her daughter to radiation. No missed dosed of eliquis .  F/u in afib clinic, 04/18/17, pt had been set up for cardioversion several weeks ago but called and cancelled as she developed an URI. She is now getting over it and is back in the afib clinic to further discuss. I am concerned that now she has been in afib for 2 months and her atrium are severly dilated. I am surprised that she stayed in SR as well as she did since last cardioversion about 2 years ago.  F/u in afib clinic 05/02/17, she has been started on flecainide   50 mg bid, after a low risk stress test. EKG shows no interval change but  remains in afib, rate controlled. She is over her URI and is feeling better from that standpoint. No missed doses of eliquis .  F/u in afib clinic, after successful cardioversion 9/01,but she feels dizzy despite being in SR.Elizabeth Sherman She called office last week with these c/o and flecainide  was reduced to 50 mg bid. Ekg shows SR at 85 bpm, intervals OK. Will try to reduce to 25 mg bid and see if symptoms improve. She refuses a flecainide  level today.  F/u in fib clinic, 08/12/17. She is staying in SR. She feels well. She called in after cardioversion c/o  dizziness and flecainide  was reduced to 25 mg bid. This has kept her in SR and eliminated her c/o dizziness.  F/u in afib clinic 02/03/18. She is doing well staying in SR on flecainide . Voices no complaints today.  F/u in afib clinic, 12/09/18 and is in SR. She has not noted any afib and is staying in rhythm on flecainide  50 mg bid. No issues with DOAC. She was having some dizziness and her PCP reduced her losartan  to 25 mg daily and this resolved the dizziness which reasonable BP control.  F/u in afib clinic, 03/27/19. She feels well. No awareness of afib. Continues on flecainide . No bleeding issues with eliquis .  F/u 09/29/19. She states no awareness of afib. Continues on flecainide . No issues  with bleeding on eliquis  with a CHA2DS2VASc score of 4.  F/u in afib clinic, 04/01/20. She is staying in SR on flecainide . No awareness of afib. No issues with bleeding, remains on 5 mg eliquis  bid.  CHA2DS2VASc score is  at least 4.  F/u in afib clinic, 08/31/20. She  reports feeling very well. No symptoms of afib. She continues on eliquis  with a CHA2DS2VASc score of 4.  F/u in afib clinic, 03/22/21. She continues on flecainide  with a CHA2DS2VASc score of 4. No issues with bleeding. No afib to report.   F/u in afib office, 09/12/21,  with afib noted last week at time of eye surgery. Pt is asymptomatic. She would prefer not to have a CV if possible. Discussed increasing rate control first and she is in agreement.   F/u in afib clinic 09/27/21 and she remains in rate controlled afib. No missed anticoagulation. Is compliant with flecainide . She states thjat the afib is not bothering her and she really does not want another cardioversion.   F/u in afib clinic, 10/10/21, as pt called back to clinic several days after last being seen and decided that she did want a cardioversion. She has recently noted more shortness of breath. She is not retaining fluid , as her weight is down 4 lbs. I stopped flecainide  on last visit as pt felt she wanted a rate control strategy but she has been back on flecainide  50 mg bid x one week.   F/u in afib clinic, 10/25/21. She had a successful cardioversion and is in SR today. BP elevated on presentation, rechecked with a systolic of 136. She states at home, her BP is usually under 130 systolic.  F/u in afib clinic, 05/01/22. She remains in SR. She has lost around 24 lbs. She has been under stress with the health of her boyfriend and is not eating as much.He is living with her  after his stroke. She remains in SR and on flecainide .   Follow up in the AF clinic 10/31/22. Patient reports that she has done well from an afib standpoint. She did fall and break her hip in February and  had surgery at [facility]. She is walking with a cane. No bleeding issues on anticoagulation.   Follow up in the AF clinic 05/08/23. Patient reports that she has done well since her last visit. She has not had any interim symptoms of afib. She did fall again and break her R elbow, now healed. No bleeding issues on anticoagulation.   Follow up 10/01/23. Patient returns for follow up for atrial fibrillation and flecainide  monitoring. She saw her PCP about two weeks ago and was found to have an irregular rhythm. In hindsight, patient has felt more fatigued for 3-4 weeks.  She is in afib today. There were no specific triggers that she could identify.   Follow up 10/18/23. Patient is s/p DCCV on 10/04/23. Unfortunately, she has had quick return of afib. She continues to have symptoms of fatigue and intermittent dizziness. No bleeding issues on anticoagulation.   Follow up 11/22/23. Patient returns for follow up for atrial fibrillation and amiodarone  monitoring. She remains in rate controlled afib with fatigue and intermittent dizziness. No bleeding issues on anticoagulation. No side effects since starting amiodarone .   Follow up 02/24/24. Patient returns for follow up for atrial fibrillation and amiodarone  monitoring. She was scheduled for DCCV on 12/06/23 but this was cancelled because her ride fell through. She remains in afib today although she admits she is not as symptomatic. Her intermittent dizziness continues. No bleeding issues on anticoagulation.   Follow up 03/23/24. Patient returns for follow up for atrial fibrillation. She remains in afib today. She was loaded on amiodarone  but did not tolerate due to dizziness. She states she feels great today. She reports that she has a head CT scheduled later this month to evaluate her dizziness because it did not improve off amiodarone . No bleeding issues on anticoagulation.    Today, she  denies symptoms of palpitations, chest pain, shortness of  breath, orthopnea, PND, lower extremity edema, presyncope, syncope, snoring, daytime somnolence, bleeding, or neurologic sequela. The patient is tolerating medications without difficulties and is otherwise without complaint today.    Past Medical History:  Diagnosis Date   A-fib (HCC)    Depression    Hyperlipidemia    Hypertension     Current Outpatient Medications  Medication Sig Dispense Refill   busPIRone  (BUSPAR ) 5 MG tablet Take 1 tablet (5 mg total) by mouth 3 (three) times daily as needed. (Patient taking differently: Take 5 mg by mouth 2 (two) times daily.) 15 tablet 1   Cholecalciferol (VITAMIN D) 50 MCG (2000 UT) tablet Take 2,000 Units by mouth daily.     diltiazem  (CARDIZEM  CD) 120 MG 24 hr capsule TAKE 1 CAPSULE BY MOUTH DAILY 90 capsule 1   ELIQUIS  5 MG TABS tablet TAKE 1 TABLET BY MOUTH TWICE  DAILY 180 tablet 3   hydrochlorothiazide (HYDRODIURIL) 25 MG tablet Take 25 mg by mouth daily as needed (Ankle swelling).     traMADol (ULTRAM) 50 MG tablet Take 100 mg by mouth every 6 (six) hours as needed for severe pain (pain score 7-10).     No current facility-administered medications for this encounter.    ROS- All systems are reviewed and negative except as per the HPI above  Physical Exam: Vitals:   03/23/24 1126  Height: 5' 6 (1.676 m)    GEN: Well nourished, well developed in no acute distress CARDIAC: Irregularly irregular rate and rhythm, no murmurs, rubs, gallops RESPIRATORY:  Clear to auscultation without rales, wheezing or rhonchi  ABDOMEN: Soft, non-tender, non-distended EXTREMITIES:  No edema; No deformity    EKG today demonstrates Afib Vent. rate 83 BPM PR interval * ms QRS duration 88 ms QT/QTcB 362/425 ms   Echo--01/2016-  Left ventricle: The cavity size was normal. Wall thickness was   increased in a pattern of mild LVH. Systolic function was normal.   The estimated ejection fraction was in the range of 60% to 65%.   Indeterminant diastolic  function (atrial fibrillation). Wall   motion was normal; there were no regional wall motion   abnormalities. - Aortic valve: There was no stenosis. - Mitral valve: There was  trivial regurgitation. - Left atrium: The atrium was severely dilated, 41 mm, diameter, LA volume 103 ml - Right ventricle: The cavity size was normal. Systolic function   was normal. - Right atrium: The atrium was severely dilated. - Pulmonary arteries: No complete TR doppler jet so unable to   estimate PA systolic pressure. - Inferior vena cava: The vessel was normal in size. The   respirophasic diameter changes were in the normal range (>= 50%),   consistent with normal central venous pressure. - Pericardium, extracardiac: Prominent epicardial adipose tissue.   Impressions: - The patient was in atrial fibrillation. Normal LV size with mild   LV hypertrophy. EF 60-65%. Normal RV size and systolic function.   No significant valvular abnormalities. Severe biatrial   enlargement.   CHA2DS2-VASc Score = 4  The patient's score is based upon: CHF History: 0 HTN History: 1 Diabetes History: 0 Stroke History: 0 Vascular Disease History: 0 Age Score: 2 Gender Score: 1       ASSESSMENT AND PLAN: Persistent Atrial Fibrillation (ICD10:  I48.19) The patient's CHA2DS2-VASc score is 4, indicating a 4.8% annual risk of stroke.   Previously failed flecainide . She stopped amiodarone  due to dizziness but her symptoms did not improve off the medication.  We discussed rate vs rhythm control including resuming amiodarone  or dofetilide admission. She would like to pursue rate control only at this time. Follow up in 3 months to reassess rate control.  Continue diltiazem  120 mg daily  Continue Eliquis  5 mg BID  Secondary Hypercoagulable State (ICD10:  D68.69) The patient is at significant risk for stroke/thromboembolism based upon her CHA2DS2-VASc Score of 4.  Continue Apixaban  (Eliquis ). No bleeding issues.   HTN H/o  white coat HTN Stable on current regimen   Follow up in the AF clinic in 3 months.    Daril Kicks PA-C [facility] [address] [place], [place] [address] [phone]

## 2024-04-01 ENCOUNTER — Ambulatory Visit: Admitting: Orthopedic Surgery

## 2024-04-20 ENCOUNTER — Encounter: Payer: Self-pay | Admitting: Orthopedic Surgery

## 2024-04-20 ENCOUNTER — Other Ambulatory Visit (INDEPENDENT_AMBULATORY_CARE_PROVIDER_SITE_OTHER): Payer: Self-pay

## 2024-04-20 ENCOUNTER — Ambulatory Visit: Admitting: Orthopedic Surgery

## 2024-04-20 VITALS — BP 163/95 | HR 89 | Ht 66.0 in | Wt 182.0 lb

## 2024-04-20 DIAGNOSIS — M47812 Spondylosis without myelopathy or radiculopathy, cervical region: Secondary | ICD-10-CM

## 2024-04-20 MED ORDER — METHYLPREDNISOLONE 4 MG PO TBPK: ORAL_TABLET | ORAL | 0 refills | Status: AC

## 2024-04-20 MED ORDER — TIZANIDINE HCL 2 MG PO TABS: 2.0000 mg | ORAL_TABLET | Freq: Four times a day (QID) | ORAL | 5 refills | Status: AC | PRN

## 2024-04-20 NOTE — Progress Notes (Addendum)
 Office Visit Note   Patient: Elizabeth Sherman           Date of Birth: 1940-11-11           MRN: [id number] Visit Date: 04/20/2024 Requested by: Donnise Norleen Lenis, MD [facility] [address] [place],  [place] [address] PCP: Donnise Norleen Lenis, MD   Assessment & Plan:   Encounter Diagnosis  Name Primary?   Spondylosis of cervical region without myelopathy or radiculopathy Yes    Meds ordered this encounter  Medications   methylPREDNISolone (MEDROL DOSEPAK) 4 MG TBPK tablet    Sig: 6 days as directed    Dispense:  21 tablet    Refill:  0   tiZANidine (ZANAFLEX) 2 MG tablet    Sig: Take 1 tablet (2 mg total) by mouth every 6 (six) hours as needed for muscle spasms.    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 recommend a steroid Dosepak continue Tylenol and physical therapy and a muscle relaxer  The patient does not want to go to physical therapy  If this does not help, recommend she see [facility] for spine consult   Subjective: Chief Complaint  Patient presents with   Neck Pain    HPI: 83 year old female had a hemiarthroplasty of the hip in February at [facility] clinic since that time she has had ongoing neck pain and stiffness  Factors include atrial fibrillation requiring anticoagulation              ROS: She denies numbness tingling or weakness and no evidence of gait disturbance   Images personally read and my interpretation :  DG Cervical Spine 2 or 3 views Result Date: 04/20/2024 Neck pain after fall patient fractured her hip in February had a bipolar neck pain since Slight loss of cervical lordosis but maintains most of the lordotic curve.  The disc spaces are normal up to the 5 6 and 7 level where there is narrowing and osteophytic changes, she also has some mid cervical disease in the uncovertebral joints Impression cervical disc disease without myelopathy on physical exam      Visit Diagnoses:  1. Spondylosis of cervical region without myelopathy or radiculopathy      Follow-Up Instructions: No follow-ups on file.    Objective: Vital Signs: BP (!) 163/95   Pulse 89   Ht 5' 6 (1.676 m)   Wt 182 lb (82.6 kg)   BMI 29.38 kg/m   Physical Exam Constitutional:      General: She is not in acute distress.    Appearance: She is normal weight. She is not ill-appearing, toxic-appearing or diaphoretic.  HENT:     Head: Normocephalic and atraumatic.     Nose: No congestion or rhinorrhea.  Eyes:     General: No scleral icterus.       Right eye: No discharge.        Left eye: No discharge.     Extraocular Movements: Extraocular movements intact.     Conjunctiva/sclera: Conjunctivae normal.     Pupils: Pupils are equal, round, and reactive to light.  Skin:    Capillary Refill: Capillary refill takes less than 2 seconds.     Coloration: Skin is not jaundiced.     Findings: Bruising present. No rash.     Comments: Multiple areas of punctate subcutaneous bruising erythematous secondary to anticoagulation  Neurological:     Mental  Status: She is alert and oriented to person, place, and time.     Sensory: No sensory deficit.     Motor: No weakness.     Coordination: Coordination normal.     Gait: Gait normal.  Psychiatric:        Mood and Affect: Mood normal.        Behavior: Behavior normal.        Thought Content: Thought content normal.        Judgment: Judgment normal.      Back Exam   Tenderness  The patient is experiencing tenderness in the cervical.  Range of Motion  Extension:  abnormal  Flexion:  abnormal  Lateral bend right:  abnormal  Lateral bend left:  abnormal  Rotation right:  abnormal  Rotation left:  abnormal   Other  Gait: normal   Comments:  Grip strength is normal bilaterally       Specialty Comments:  No specialty comments available.  Imaging: DG Cervical Spine 2 or 3 views Result Date: 04/20/2024 Neck pain  after fall patient fractured her hip in February had a bipolar neck pain since Slight loss of cervical lordosis but maintains most of the lordotic curve.  The disc spaces are normal up to the 5 6 and 7 level where there is narrowing and osteophytic changes, she also has some mid cervical disease in the uncovertebral joints Impression cervical disc disease without myelopathy on physical exam     PMFS History: Patient Active Problem List   Diagnosis Date Noted   Hypercoagulable state due to persistent atrial fibrillation (HCC) 10/31/2022   Closed displaced fracture of right femoral neck (HCC) 08/08/2022   Atrial flutter (HCC)    Persistent atrial fibrillation (HCC)    Past Medical History:  Diagnosis Date   A-fib (HCC)    Depression    Hyperlipidemia    Hypertension     Family History  Problem Relation Age of Onset   Heart failure Mother     Past Surgical History:  Procedure Laterality Date   ABDOMINAL HYSTERECTOMY     APPENDECTOMY     CARDIOVERSION N/A 02/17/2016   Procedure: CARDIOVERSION;  Surgeon: Vinie JAYSON Maxcy, MD;  Location: [facility] ENDOSCOPY;  Service: Cardiovascular;  Laterality: N/A;   CARDIOVERSION N/A 05/14/2017   Procedure: CARDIOVERSION;  Surgeon: Rolan Ezra RAMAN, MD;  Location: [facility] ENDOSCOPY;  Service: Cardiovascular;  Laterality: N/A;   CARDIOVERSION N/A 10/20/2021   Procedure: CARDIOVERSION;  Surgeon: Maxcy Vinie JAYSON, MD;  Location: [facility] ENDOSCOPY;  Service: Cardiovascular;  Laterality: N/A;   CARDIOVERSION N/A 10/04/2023   Procedure: CARDIOVERSION;  Surgeon: Loni Soyla LABOR, MD;  Location: [facility] INVASIVE CV LAB;  Service: Cardiovascular;  Laterality: N/A;   Social History   Occupational History   Not on file  Tobacco Use   Smoking status: Never   Smokeless tobacco: Never   Tobacco comments:    Never smoked 05/08/23  Substance and Sexual Activity   Alcohol use: No   Drug use: No   Sexual activity: Not on file

## 2024-04-20 NOTE — Addendum Note (Signed)
 Addended by: MARGRETTE TAFT BRAVO on: 04/20/2024 09:47 AM   Modules accepted: Orders

## 2024-04-20 NOTE — Progress Notes (Signed)
  Intake history:  Chief Complaint  Patient presents with   Neck Pain     BP (!) 163/95   Pulse 89   Ht 5' 6 (1.676 m)   Wt 182 lb (82.6 kg)   BMI 29.38 kg/m  Body mass index is 29.38 kg/m.  Pharmacy? ________[facility]______________________________  WHAT ARE WE SEEING YOU FOR TODAY?   Neck pain  How long has this bothered you? (DOI?DOS?WS?)  For a couple years, she fell and broke her hip has had neck pain since then   Was there an injury? Yes  Anticoag.  Yes   Any ALLERGIES _______No Known Allergies _______________________________________   Treatment:  Have you taken:  Tylenol Yes  Advil  No  Had PT No  Had injection No  Other  _________________________   Elizabeth Sherman

## 2024-04-20 NOTE — Patient Instructions (Signed)
 You have been diagnosed with a condition of the spine.  All spinal conditions are now treated in our [place] office with the spine specialist.  If you continue to have problems with your spinal condition, please call [phone] to schedule an appointment with our spine specialist.

## 2024-06-29 ENCOUNTER — Ambulatory Visit (HOSPITAL_COMMUNITY): Admitting: Physician Assistant

## 2024-07-08 ENCOUNTER — Telehealth (HOSPITAL_COMMUNITY): Payer: Self-pay | Admitting: *Deleted

## 2024-07-08 NOTE — Telephone Encounter (Signed)
 Received call from Brittany, HHPT, working with patient today stating patient complaining of dizziness which was side effect she experienced when on amiodarone  previously. Per Brittany, Amiodarone  was restarted during hospitalization for hip repair for unclear reasons.  Heart rates today are in the mid-70s BP 122/64. Discussed with  Daril Kicks PA will stop amiodarone  today. Patient to call with follow up appointment once able to travel post hip repair.
